# Patient Record
Sex: Male | Born: 1960 | Race: White | Hispanic: No | Smoking: Never smoker
Health system: Southern US, Community
[De-identification: ages and names within clinical notes are randomized; demographics above are authoritative.]

## PROBLEM LIST (undated history)

## (undated) DIAGNOSIS — C4490 Unspecified malignant neoplasm of skin, unspecified: Secondary | ICD-10-CM

## (undated) DIAGNOSIS — I4891 Unspecified atrial fibrillation: Secondary | ICD-10-CM

## (undated) DIAGNOSIS — M545 Low back pain: Secondary | ICD-10-CM

## (undated) DIAGNOSIS — N200 Calculus of kidney: Secondary | ICD-10-CM

## (undated) DIAGNOSIS — H269 Unspecified cataract: Secondary | ICD-10-CM

## (undated) DIAGNOSIS — G8929 Other chronic pain: Secondary | ICD-10-CM

## (undated) HISTORY — DX: Unspecified atrial fibrillation: I48.91

## (undated) HISTORY — PX: COLONOSCOPY: SHX174

## (undated) HISTORY — DX: Other chronic pain: G89.29

## (undated) HISTORY — DX: Unspecified malignant neoplasm of skin, unspecified: C44.90

## (undated) HISTORY — DX: Unspecified cataract: H26.9

## (undated) HISTORY — PX: ESOPHAGOGASTRODUODENOSCOPY: SHX1529

## (undated) HISTORY — DX: Low back pain: M54.5

## (undated) HISTORY — PX: OTHER SURGICAL HISTORY: SHX169

---

## 1996-03-25 HISTORY — PX: FUNCTIONAL ENDOSCOPIC SINUS SURGERY: SUR616

## 1998-11-23 ENCOUNTER — Emergency Department (HOSPITAL_COMMUNITY): Admission: EM | Admit: 1998-11-23 | Discharge: 1998-11-23 | Payer: Self-pay | Admitting: Emergency Medicine

## 2000-05-29 ENCOUNTER — Emergency Department (HOSPITAL_COMMUNITY): Admission: EM | Admit: 2000-05-29 | Discharge: 2000-05-29 | Payer: Self-pay | Admitting: Emergency Medicine

## 2000-05-29 ENCOUNTER — Encounter: Payer: Self-pay | Admitting: Emergency Medicine

## 2001-07-27 ENCOUNTER — Encounter: Payer: Self-pay | Admitting: Emergency Medicine

## 2001-07-27 ENCOUNTER — Emergency Department (HOSPITAL_COMMUNITY): Admission: EM | Admit: 2001-07-27 | Discharge: 2001-07-27 | Payer: Self-pay | Admitting: Emergency Medicine

## 2002-05-19 ENCOUNTER — Emergency Department (HOSPITAL_COMMUNITY): Admission: EM | Admit: 2002-05-19 | Discharge: 2002-05-19 | Payer: Self-pay | Admitting: Emergency Medicine

## 2002-08-20 ENCOUNTER — Emergency Department (HOSPITAL_COMMUNITY): Admission: EM | Admit: 2002-08-20 | Discharge: 2002-08-21 | Payer: Self-pay | Admitting: *Deleted

## 2003-02-25 ENCOUNTER — Encounter: Admission: RE | Admit: 2003-02-25 | Discharge: 2003-02-25 | Payer: Self-pay | Admitting: Internal Medicine

## 2003-06-05 ENCOUNTER — Emergency Department (HOSPITAL_COMMUNITY): Admission: EM | Admit: 2003-06-05 | Discharge: 2003-06-05 | Payer: Self-pay

## 2003-10-24 ENCOUNTER — Emergency Department (HOSPITAL_COMMUNITY): Admission: EM | Admit: 2003-10-24 | Discharge: 2003-10-24 | Payer: Self-pay | Admitting: Emergency Medicine

## 2003-10-26 ENCOUNTER — Ambulatory Visit (HOSPITAL_COMMUNITY): Admission: RE | Admit: 2003-10-26 | Discharge: 2003-10-26 | Payer: Self-pay | Admitting: Emergency Medicine

## 2014-05-26 ENCOUNTER — Emergency Department (HOSPITAL_COMMUNITY)
Admission: EM | Admit: 2014-05-26 | Discharge: 2014-05-26 | Disposition: A | Discharge status: Home / Self Care | Payer: BC Managed Care – PPO | Attending: Emergency Medicine | Admitting: Emergency Medicine

## 2014-05-26 ENCOUNTER — Emergency Department (HOSPITAL_COMMUNITY): Payer: BC Managed Care – PPO

## 2014-05-26 ENCOUNTER — Encounter (HOSPITAL_COMMUNITY): Payer: Self-pay | Admitting: Emergency Medicine

## 2014-05-26 DIAGNOSIS — Z79899 Other long term (current) drug therapy: Secondary | ICD-10-CM | POA: Diagnosis not present

## 2014-05-26 DIAGNOSIS — N200 Calculus of kidney: Secondary | ICD-10-CM | POA: Insufficient documentation

## 2014-05-26 DIAGNOSIS — R109 Unspecified abdominal pain: Secondary | ICD-10-CM

## 2014-05-26 HISTORY — DX: Calculus of kidney: N20.0

## 2014-05-26 LAB — URINALYSIS, ROUTINE W REFLEX MICROSCOPIC
Bilirubin Urine: NEGATIVE
Glucose, UA: NEGATIVE mg/dL
KETONES UR: NEGATIVE mg/dL
Leukocytes, UA: NEGATIVE
Nitrite: NEGATIVE
PH: 6 (ref 5.0–8.0)
Protein, ur: NEGATIVE mg/dL
Specific Gravity, Urine: 1.016 (ref 1.005–1.030)
UROBILINOGEN UA: 0.2 mg/dL (ref 0.0–1.0)

## 2014-05-26 LAB — URINE MICROSCOPIC-ADD ON

## 2014-05-26 LAB — COMPREHENSIVE METABOLIC PANEL
ALBUMIN: 4.5 g/dL (ref 3.5–5.2)
ALT: 49 U/L (ref 0–53)
AST: 35 U/L (ref 0–37)
Alkaline Phosphatase: 64 U/L (ref 39–117)
Anion gap: 7 (ref 5–15)
BILIRUBIN TOTAL: 0.5 mg/dL (ref 0.3–1.2)
BUN: 12 mg/dL (ref 6–23)
CO2: 27 mmol/L (ref 19–32)
Calcium: 9 mg/dL (ref 8.4–10.5)
Chloride: 103 mmol/L (ref 96–112)
Creatinine, Ser: 0.93 mg/dL (ref 0.50–1.35)
GFR calc Af Amer: >90 mL/min (ref >90)
GFR calc non Af Amer: >90 mL/min (ref >90)
Glucose, Bld: 112 mg/dL — ABNORMAL HIGH (ref 70–99)
Potassium: 4.3 mmol/L (ref 3.5–5.1)
SODIUM: 137 mmol/L (ref 135–145)
TOTAL PROTEIN: 7.1 g/dL (ref 6.0–8.3)

## 2014-05-26 LAB — CBC WITH DIFFERENTIAL/PLATELET
Basophils Absolute: 0 10*3/uL (ref 0.0–0.1)
Basophils Relative: 0 % (ref 0–1)
EOS ABS: 0 10*3/uL (ref 0.0–0.7)
EOS PCT: 0 % (ref 0–5)
HEMATOCRIT: 48.1 % (ref 39.0–52.0)
HEMOGLOBIN: 16.8 g/dL (ref 13.0–17.0)
LYMPHS ABS: 1.8 10*3/uL (ref 0.7–4.0)
Lymphocytes Relative: 18 % (ref 12–46)
MCH: 30.1 pg (ref 26.0–34.0)
MCHC: 34.9 g/dL (ref 30.0–36.0)
MCV: 86 fL (ref 78.0–100.0)
MONO ABS: 0.8 10*3/uL (ref 0.1–1.0)
MONOS PCT: 8 % (ref 3–12)
Neutro Abs: 7.6 10*3/uL (ref 1.7–7.7)
Neutrophils Relative %: 74 % (ref 43–77)
Platelets: 204 10*3/uL (ref 150–400)
RBC: 5.59 MIL/uL (ref 4.22–5.81)
RDW: 12.3 % (ref 11.5–15.5)
WBC: 10.2 10*3/uL (ref 4.0–10.5)

## 2014-05-26 LAB — LIPASE, BLOOD: Lipase: 28 U/L (ref 11–59)

## 2014-05-26 MED ORDER — KETOROLAC TROMETHAMINE 30 MG/ML IJ SOLN
30.0000 mg | Freq: Once | INTRAMUSCULAR | Status: AC
  Administered 2014-05-26: 30 mg via INTRAVENOUS
  Filled 2014-05-26: qty 1

## 2014-05-26 MED ORDER — HYDROCODONE-ACETAMINOPHEN 5-325 MG PO TABS: 1.0000 | ORAL_TABLET | ORAL | Status: DC | PRN

## 2014-05-26 MED ORDER — ONDANSETRON HCL 4 MG/2ML IJ SOLN
4.0000 mg | Freq: Once | INTRAMUSCULAR | Status: AC
  Administered 2014-05-26: 4 mg via INTRAVENOUS
  Filled 2014-05-26: qty 2

## 2014-05-26 MED ORDER — NAPROXEN 500 MG PO TABS: 500.0000 mg | ORAL_TABLET | Freq: Two times a day (BID) | ORAL | Status: DC

## 2014-05-26 NOTE — ED Provider Notes (Signed)
CSN: [id number]     Arrival date & time 05/26/14  1124 History   First MD Initiated Contact with Patient 05/26/14 1128     Chief Complaint  Patient presents with  . Flank Pain   Steve Nolan is a 54 y.o. male with a history of kidney stones who presents to the emergency department complaining of left flank pain beginning 3 hours ago. Patient rates his pain as 8 out of 10 and sharp and has progressively gotten worse. He reports feels similar to his previous kidney stones. He reports he went to his primary care provider's office today to request that he come to the emergency department for a CT scan. The patient is never had a CT scan for his kidney stones previously. His last BM was prior to arrival and normal. He denies injury or trauma to his back. The patient denies fevers, chills, dysuria, hematuria, abdominal pain, nausea, vomiting, diarrhea, penile or testicle pain or swelling or rashes.   (Consider location/radiation/quality/duration/timing/severity/associated sxs/prior Treatment) HPI  Past Medical History  Diagnosis Date  . Kidney stones    History reviewed. No pertinent past surgical history. History reviewed. No pertinent family history. History  Substance Use Topics  . Smoking status: Never Smoker   . Smokeless tobacco: Not on file  . Alcohol Use: No    Review of Systems  Constitutional: Negative for fever and chills.  HENT: Negative for congestion and sore throat.   Eyes: Negative for visual disturbance.  Respiratory: Negative for cough, shortness of breath and wheezing.   Cardiovascular: Negative for chest pain and palpitations.  Gastrointestinal: Negative for nausea, vomiting, abdominal pain, diarrhea, constipation and blood in stool.  Genitourinary: Positive for flank pain. Negative for dysuria, urgency, frequency, hematuria, decreased urine volume, discharge, penile swelling, scrotal swelling, difficulty urinating, genital sores, penile pain and testicular pain.   Musculoskeletal: Negative for back pain and neck pain.  Skin: Negative for rash.  Neurological: Negative for headaches.      Allergies  Sulfa antibiotics  Home Medications   Prior to Admission medications   Medication Sig Start Date End Date Taking? Authorizing Provider  tadalafil (CIALIS) 5 MG tablet Take 5 mg by mouth daily.   Yes Historical Provider, MD  HYDROcodone-acetaminophen (NORCO/VICODIN) 5-325 MG per tablet Take 1-2 tablets by mouth every 4 (four) hours as needed for severe pain. 05/26/14   Verda Cumins Mistey Hoffert, PA-C  naproxen (NAPROSYN) 500 MG tablet Take 1 tablet (500 mg total) by mouth 2 (two) times daily with a meal. 05/26/14   Verda Cumins Aviel Davalos, PA-C   BP 157/60 mmHg  Pulse 67  Temp(Src) 97.7 F (36.5 C) (Oral)  Resp 18  SpO2 99% Physical Exam  Constitutional: He appears well-developed and well-nourished. No distress.  HENT:  Head: Normocephalic and atraumatic.  Mouth/Throat: Oropharynx is clear and moist. No oropharyngeal exudate.  Eyes: Conjunctivae are normal. Pupils are equal, round, and reactive to light. Right eye exhibits no discharge. Left eye exhibits no discharge.  Neck: Neck supple.  Cardiovascular: Normal rate, regular rhythm, normal heart sounds and intact distal pulses.  Exam reveals no gallop and no friction rub.   No murmur heard. Pulmonary/Chest: Effort normal and breath sounds normal. No respiratory distress. He has no wheezes. He has no rales.  Abdominal: Soft. Bowel sounds are normal. He exhibits no distension and no mass. There is no tenderness. There is no rebound and no guarding.  Abdomen is soft and nontender to palpation. Bowel sounds are present. No CVA or  flank tenderness.  Musculoskeletal: He exhibits no edema or tenderness.  No back tenderness.   Lymphadenopathy:    He has no cervical adenopathy.  Neurological: He is alert. Coordination normal.  Skin: Skin is warm and dry. No rash noted. He is not diaphoretic. No erythema. No  pallor.  Psychiatric: He has a normal mood and affect. His behavior is normal.  Nursing note and vitals reviewed.   ED Course  Procedures (including critical care time) Labs Review Labs Reviewed  COMPREHENSIVE METABOLIC PANEL - Abnormal; Notable for the following:    Glucose, Bld 112 (*)    All other components within normal limits  URINALYSIS, ROUTINE W REFLEX MICROSCOPIC - Abnormal; Notable for the following:    Hgb urine dipstick MODERATE (*)    All other components within normal limits  LIPASE, BLOOD  CBC WITH DIFFERENTIAL/PLATELET  URINE MICROSCOPIC-ADD ON    Imaging Review Ct Renal Stone Study  05/26/2014   CLINICAL DATA:  Left flank pain starting in the morning  EXAM: CT ABDOMEN AND PELVIS WITHOUT CONTRAST  TECHNIQUE: Multidetector CT imaging of the abdomen and pelvis was performed following the standard protocol without IV contrast.  COMPARISON:  None.  FINDINGS: Lung bases are unremarkable. Sagittal images of the spine are unremarkable. There is mild hepatic fatty infiltration. No focal hepatic mass. No calcified gallstones are noted within gallbladder. Unenhanced pancreas, spleen and adrenal glands are unremarkable. There is mild left hydronephrosis. Moderate left hydroureter. Mild left perinephric stranding.  Nonobstructive calculus in midpole of the left kidney measures 4 mm.  Axial image 82 there is calcified obstructive calculus in distal left ureter measures 6 mm just above the left UVJ. No calcified calculi are noted within urinary bladder.  Abdominal aorta is unremarkable. No small bowel obstruction. Normal appendix. No pericecal inflammation. No right ureteral calculi.  No ascites or free air.  Moderate stool noted in cecum.  Prostate gland and seminal vesicles are unremarkable.  IMPRESSION: 1. There is mild left hydronephrosis and moderate left hydroureter. 2. There is 6 mm calcified obstructive calculus in distal left ureter just above the left UVJ. 3. Nonobstructive calculus  midpole of the left kidney measures 4 mm. 4. Fatty infiltration of the liver. 5. Normal appendix.  No pericecal inflammation. 6. No small bowel obstruction.   Electronically Signed   By: Lahoma Crocker M.D.   On: 05/26/2014 12:18     EKG Interpretation None      Filed Vitals:   05/26/14 1131 05/26/14 1200  BP: 157/60   Pulse: 67   Temp:  97.7 F (36.5 C)  TempSrc:  Oral  Resp: 18   SpO2: 99%      MDM   Meds given in ED:  Medications  ondansetron (ZOFRAN) injection 4 mg (4 mg Intravenous Given 05/26/14 1153)  ketorolac (TORADOL) 30 MG/ML injection 30 mg (30 mg Intravenous Given 05/26/14 1153)    New Prescriptions   HYDROCODONE-ACETAMINOPHEN (NORCO/VICODIN) 5-325 MG PER TABLET    Take 1-2 tablets by mouth every 4 (four) hours as needed for severe pain.   NAPROXEN (NAPROSYN) 500 MG TABLET    Take 1 tablet (500 mg total) by mouth 2 (two) times daily with a meal.    Final diagnoses:  Left flank pain  Kidney stone on left side   This is a 54 y.o. male with a history of kidney stones who presents to the emergency department complaining of left flank pain beginning 3 hours ago. Patient rates his pain as 8 out  of 10 and sharp and has progressively gotten worse. He reports feels similar to his previous kidney stones. Patient reports he has never had a CT scan of his abdomen previously. He would like a CT scan today. Patient denies any fevers, dysuria or hematuria. The patient's abdomen is soft and nontender to palpation. Patient is afebrile and nontoxic-appearing. Patient has no flank or CVA tenderness. Urinalysis indicates moderate hemoglobin and no signs of infection.  CMP and CBC are unremarkable. CT renal stone study shows a 6 mm obstructive calculus in the distal left ureter just above the left UVJ. There is also mild left hydronephrosis and moderate left hydroureter.  At reevaluation the patient reports his pain is completely resolved and he feels ready to be discharged. Patient provided  prescriptions for Naprosyn and Norco for breakthrough pain. Patient given follow-up information with urologist Dr. Risa Grill. I advised the patient to follow-up with their primary care provider this week. I advised the patient to return to the emergency department with new or worsening symptoms or new concerns. The patient verbalized understanding and agreement with plan.       Hanley Hays, PA-C 05/26/14 [place], DO 05/26/14 1546

## 2014-05-26 NOTE — ED Notes (Signed)
Provider at bedside

## 2014-05-26 NOTE — Discharge Instructions (Signed)
Kidney Stones  Kidney stones (urolithiasis) are deposits that form inside your kidneys. The intense pain is caused by the stone moving through the urinary tract. When the stone moves, the ureter goes into spasm around the stone. The stone is usually passed in the urine.   CAUSES   · A disorder that makes certain neck glands produce too much parathyroid hormone (primary hyperparathyroidism).  · A buildup of uric acid crystals, similar to gout in your joints.  · Narrowing (stricture) of the ureter.  · A kidney obstruction present at birth (congenital obstruction).  · Previous surgery on the kidney or ureters.  · Numerous kidney infections.  SYMPTOMS   · Feeling sick to your stomach (nauseous).  · Throwing up (vomiting).  · Blood in the urine (hematuria).  · Pain that usually spreads (radiates) to the groin.  · Frequency or urgency of urination.  DIAGNOSIS   · Taking a history and physical exam.  · Blood or urine tests.  · CT scan.  · Occasionally, an examination of the inside of the urinary bladder (cystoscopy) is performed.  TREATMENT   · Observation.  · Increasing your fluid intake.  · Extracorporeal shock wave lithotripsy--This is a noninvasive procedure that uses shock waves to break up kidney stones.  · Surgery may be needed if you have severe pain or persistent obstruction. There are various surgical procedures. Most of the procedures are performed with the use of small instruments. Only small incisions are needed to accommodate these instruments, so recovery time is minimized.  The size, location, and chemical composition are all important variables that will determine the proper choice of action for you. Talk to your health care provider to better understand your situation so that you will minimize the risk of injury to yourself and your kidney.   HOME CARE INSTRUCTIONS   · Drink enough water and fluids to keep your urine clear or pale yellow. This will help you to pass the stone or stone fragments.  · Strain  all urine through the provided strainer. Keep all particulate matter and stones for your health care provider to see. The stone causing the pain may be as small as a grain of salt. It is very important to use the strainer each and every time you pass your urine. The collection of your stone will allow your health care provider to analyze it and verify that a stone has actually passed. The stone analysis will often identify what you can do to reduce the incidence of recurrences.  · Only take over-the-counter or prescription medicines for pain, discomfort, or fever as directed by your health care provider.  · Make a follow-up appointment with your health care provider as directed.  · Get follow-up X-rays if required. The absence of pain does not always mean that the stone has passed. It may have only stopped moving. If the urine remains completely obstructed, it can cause loss of kidney function or even complete destruction of the kidney. It is your responsibility to make sure X-rays and follow-ups are completed. Ultrasounds of the kidney can show blockages and the status of the kidney. Ultrasounds are not associated with any radiation and can be performed easily in a matter of minutes.  SEEK MEDICAL CARE IF:  · You experience pain that is progressive and unresponsive to any pain medicine you have been prescribed.  SEEK IMMEDIATE MEDICAL CARE IF:   · Pain cannot be controlled with the prescribed medicine.  · You have a fever or   shaking chills.  · The severity or intensity of pain increases over 18 hours and is not relieved by pain medicine.  · You develop a new onset of abdominal pain.  · You feel faint or pass out.  · You are unable to urinate.  MAKE SURE YOU:   · Understand these instructions.  · Will watch your condition.  · Will get help right away if you are not doing well or get worse.  Document Released: 03/11/2005 Document Revised: 11/11/2012 Document Reviewed: 08/12/2012  ExitCare® Patient Information ©2015  [employer]. This information is not intended to replace advice given to you by your health care provider. Make sure you discuss any questions you have with your health care provider.    Dietary Guidelines to Help Prevent Kidney Stones  Your risk of kidney stones can be decreased by adjusting the foods you eat. The most important thing you can do is drink enough fluid. You should drink enough fluid to keep your urine clear or pale yellow. The following guidelines provide specific information for the type of kidney stone you have had.  GUIDELINES ACCORDING TO TYPE OF KIDNEY STONE  Calcium Oxalate Kidney Stones  · Reduce the amount of salt you eat. Foods that have a lot of salt cause your body to release excess calcium into your urine. The excess calcium can combine with a substance called oxalate to form kidney stones.  · Reduce the amount of animal protein you eat if the amount you eat is excessive. Animal protein causes your body to release excess calcium into your urine. Ask your dietitian how much protein from animal sources you should be eating.  · Avoid foods that are high in oxalates. If you take vitamins, they should have less than 500 mg of vitamin C. Your body turns vitamin C into oxalates. You do not need to avoid fruits and vegetables high in vitamin C.  Calcium Phosphate Kidney Stones  · Reduce the amount of salt you eat to help prevent the release of excess calcium into your urine.  · Reduce the amount of animal protein you eat if the amount you eat is excessive. Animal protein causes your body to release excess calcium into your urine. Ask your dietitian how much protein from animal sources you should be eating.  · Get enough calcium from food or take a calcium supplement (ask your dietitian for recommendations). Food sources of calcium that do not increase your risk of kidney stones include:  ¨ Broccoli.  ¨ Dairy products, such as cheese and yogurt.  ¨ Pudding.  Uric Acid Kidney Stones  · Do not  have more than 6 oz of animal protein per day.  FOOD SOURCES  Animal Protein Sources  · Meat (all types).  · Poultry.  · Eggs.  · Fish, seafood.  Foods High in Salt  · Salt seasonings.  · Soy sauce.  · Teriyaki sauce.  · Cured and processed meats.  · Salted crackers and snack foods.  · Fast food.  · Canned soups and most canned foods.  Foods High in Oxalates  · Grains:  ¨ Amaranth.  ¨ Barley.  ¨ Grits.  ¨ Wheat germ.  ¨ Bran.  ¨ Buckwheat flour.  ¨ All bran cereals.  ¨ Pretzels.  ¨ Whole wheat bread.  · Vegetables:  ¨ Beans (wax).  ¨ Beets and beet greens.  ¨ Collard greens.  ¨ Eggplant.  ¨ Escarole.  ¨ Leeks.  ¨ Okra.  ¨ Parsley.  ¨ Rutabagas.  ¨   Spinach.  ¨ Swiss chard.  ¨ Tomato paste.  ¨ Fried potatoes.  ¨ Sweet potatoes.  · Fruits:  ¨ Red currants.  ¨ Figs.  ¨ Kiwi.  ¨ Rhubarb.  · Meat and Other Protein Sources:  ¨ Beans (dried).  ¨ Soy burgers and other soybean products.  ¨ Miso.  ¨ Nuts (peanuts, almonds, pecans, cashews, hazelnuts).  ¨ Nut butters.  ¨ Sesame seeds and tahini (paste made of sesame seeds).  ¨ Poppy seeds.  · Beverages:  ¨ Chocolate drink mixes.  ¨ Soy milk.  ¨ Instant iced tea.  ¨ Juices made from high-oxalate fruits or vegetables.  · Other:  ¨ Carob.  ¨ Chocolate.  ¨ Fruitcake.  ¨ Marmalades.  Document Released: 07/06/2010 Document Revised: 03/16/2013 Document Reviewed: 02/05/2013  ExitCare® Patient Information ©2015 [employer]. This information is not intended to replace advice given to you by your health care provider. Make sure you discuss any questions you have with your health care provider.

## 2014-05-26 NOTE — ED Notes (Signed)
Pt states that he has hx of kidney stones.  Pt states that around 0900, he began having lt sided flank pain.  Denies dysuria/hematuria.

## 2016-11-11 ENCOUNTER — Ambulatory Visit: Admitting: Family Medicine

## 2016-11-11 ENCOUNTER — Ambulatory Visit (INDEPENDENT_AMBULATORY_CARE_PROVIDER_SITE_OTHER): Payer: BC Managed Care – PPO | Admitting: Family Medicine

## 2016-11-11 ENCOUNTER — Encounter: Payer: Self-pay | Admitting: Family Medicine

## 2016-11-11 VITALS — BP 118/80 | HR 93 | Temp 98.3°F | Ht 69.0 in | Wt 195.0 lb

## 2016-11-11 DIAGNOSIS — M545 Low back pain, unspecified: Secondary | ICD-10-CM

## 2016-11-11 DIAGNOSIS — G8929 Other chronic pain: Secondary | ICD-10-CM | POA: Diagnosis not present

## 2016-11-11 DIAGNOSIS — Z1283 Encounter for screening for malignant neoplasm of skin: Secondary | ICD-10-CM | POA: Diagnosis not present

## 2016-11-11 DIAGNOSIS — M25512 Pain in left shoulder: Secondary | ICD-10-CM

## 2016-11-11 HISTORY — DX: Other chronic pain: G89.29

## 2016-11-11 HISTORY — DX: Low back pain, unspecified: M54.50

## 2016-11-11 NOTE — Progress Notes (Signed)
Pre visit review using our clinic review tool, if applicable. No additional management support is needed unless otherwise documented below in the visit note. 

## 2016-11-11 NOTE — Progress Notes (Signed)
Chief Complaint  Patient presents with  . Establish Care       New Patient Visit SUBJECTIVE: HPI: Steve Nolan is an 56 y.o.male who is being seen for establishing care.  The patient was previously seen at [facility], [place].  Low back pain This has been going on for several months. Waxes and wanes. He had a lumbar spine X-ray that showed some mild degenerative changes. He is particularly concerned that it showed curvature and his friend's son needed a rod implanted surgically.The pain is on the left side. He does not do any routine stretching/exercises for his low back. He denies any numbness, tingling, weakness, or loss of bowel/bladder function.  L shoulder Had arthroscopic shoulder surgery in the late 80's for SLAP. He started having overhead lifting issues over the past year. 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 For the past year, he has been having issues with the posterior area of the shoulder, particularly with overhead activities. He believes that he was bench pressing when it started. No catching or locking.  Skin Used to have a dermatologist, but his old one retired. He had precancerous lesions on his head and would like to have them checked again.   Allergies  Allergen Reactions  . Sulfa Antibiotics Rash    Past Medical History:  Diagnosis Date  . A-fib ([place])   . Anemia   . Chronic low back pain 11/11/2016  . Kidney stones    Past Surgical History:  Procedure Laterality Date  . FUNCTIONAL ENDOSCOPIC SINUS SURGERY Bilateral 1998   deviated septum  . left shoulder surgery Left    arthoscopic   Social History   Social History  . Marital status: Divorced   Social History Main Topics  . Smoking status: Never Smoker  . Smokeless tobacco: Never Used  . Alcohol use No  . Drug use: No   Family History  Problem Relation Age of Onset  . Hypertension Mother   . Diabetes Mother   . Heart disease Father   . Stroke Maternal  Grandmother   . Hypertension Maternal Grandmother   . Drug abuse Maternal Grandmother   . Heart disease Maternal Grandfather   . Hypertension Maternal Grandfather   . Heart disease Paternal Grandfather      Current Outpatient Prescriptions:  .  tadalafil (CIALIS) 5 MG tablet, Take 5 mg by mouth daily., Disp: , Rfl:   ROS Neuro: Denies numbness/tingling  MSK: As noted in HPI   OBJECTIVE: BP 118/80 (BP Location: Left Arm, Patient Position: Sitting, Cuff Size: Normal)   Pulse 93   Temp 98.3 F (36.8 C) (Oral)   Ht 5\' 9"  (1.753 m)   Wt 195 lb (88.5 kg)   SpO2 95%   BMI 28.80 kg/m   Constitutional: -  VS reviewed -  Well developed, well nourished, appears stated age -  No apparent distress  Psychiatric: -  Oriented to person, place, and time -  Memory intact -  Affect and mood normal -  Fluent conversation, good eye contact -  Judgment and insight age appropriate  Eye: -  Conjunctivae clear, no discharge -  Pupils symmetric, round, reactive to light  ENMT: -  MMM    Pharynx moist, no exudate, no erythema  Neck: -  No gross swelling, no palpable masses -  Thyroid midline, not enlarged, mobile, no palpable masses  Cardiovascular: -  RRR -  No LE edema  Respiratory: -  Normal respiratory effort, no accessory  muscle use, no retraction -  Breath sounds equal, no wheezes, no ronchi, no crackles  Neurological:  -  CN II - XII grossly intact -  Sensation grossly intact to light touch, equal bilaterally  Musculoskeletal: -  No clubbing, no cyanosis -  L shoulder- slightly decreased forward flexion, negative speed's, Yergason's, O'Brien's, crossover, liftoff, Hawkins, +empty can; there is TTP over the posterior deltoid -  +TTP over left lumbar paraspinal musculature, negative straight leg, negative besides, 5/5 strength throughout the lower extremities;   Skin: -  No significant lesion on inspection -  Warm and dry to palpation   ASSESSMENT/PLAN: Chronic left-sided low back  pain without sciatica  Left shoulder pain, unspecified chronicity  Skin exam, screening for cancer - Plan: Ambulatory referral to Dermatology  Patient instructed to sign release of records form from his previous PCP. NSAIDs. Tylenol. Heat. Ice. Stretches/exercises given for both shoulder and back. No red flag signs.  Refer to derm per his request. Patient should return in 4 weeks to recheck shoulder and back pain. Will consider injection vs PT for shoulder and back respectively. The patient voiced understanding and agreement to the plan.  Greater than 40 minutes were spent face to face with the patient with greater than 50% of this time spent counseling on etiology and treatment of low back pain, discussing the results of his lumbar XR, shoulder pain etiology/treatment.    [place], DO 11/11/16  3:10 PM

## 2016-11-11 NOTE — Patient Instructions (Signed)
If you do not hear anything about your referral in the next 1-2 weeks, call our office and ask for an update.  EXERCISES  RANGE OF MOTION (ROM) AND STRETCHING EXERCISES These exercises may help you when beginning to rehabilitate your injury. Your symptoms may resolve with or without further involvement from your physician, physical therapist or athletic trainer. While completing these exercises, remember:   Restoring tissue flexibility helps normal motion to return to the joints. This allows healthier, less painful movement and activity.  An effective stretch should be held for at least 30 seconds.  A stretch should never be painful. You should only feel a gentle lengthening or release in the stretched tissue.  ROM - Pendulum  Bend at the waist so that your right / left arm falls away from your body. Support yourself with your opposite hand on a solid surface, such as a table or a countertop.  Your right / left arm should be perpendicular to the ground. If it is not perpendicular, you need to lean over farther. Relax the muscles in your right / left arm and shoulder as much as possible.  Gently sway your hips and trunk so they move your right / left arm without any use of your right / left shoulder muscles.  Progress your movements so that your right / left arm moves side to side, then forward and backward, and finally, both clockwise and counterclockwise.  Complete 10-15 repetitions in each direction. Many people use this exercise to relieve discomfort in their shoulder as well as to gain range of motion. Repeat 2-3 times. Complete this exercise once per day.  STRETCH - Flexion, Standing  Stand with good posture. With an underhand grip on your right / left hand and an overhand grip on the opposite hand, grasp a broomstick or cane so that your hands are a little more than shoulder-width apart.  Keeping your right / left elbow straight and shoulder muscles relaxed, push the stick with  your opposite hand to raise your right / left arm in front of your body and then overhead. Raise your arm until you feel a stretch in your right / left shoulder, but before you have increased shoulder pain.  Try to avoid shrugging your right / left shoulder as your arm rises by keeping your shoulder blade tucked down and toward your mid-back spine. Hold 15-20 seconds.  Slowly return to the starting position. Repeat 2-3 times. Complete this exercise once per day.  STRETCH - Internal Rotation  Place your right / left hand behind your back, palm-up.  Throw a towel or belt over your opposite shoulder. Grasp the towel/belt with your right / left hand.  While keeping an upright posture, gently pull up on the towel/belt until you feel a stretch in the front of your right / left shoulder.  Avoid shrugging your right / left shoulder as your arm rises by keeping your shoulder blade tucked down and toward your mid-back spine.  Hold 15-20. Release the stretch by lowering your opposite hand. Repeat 2-3 times. Complete this exercise once per day.  STRETCH - External Rotation and Abduction  Stagger your stance through a doorframe. It does not matter which foot is forward.  As instructed by your physician, physical therapist or athletic trainer, place your hands: ? And forearms above your head and on the door frame. ? And forearms at head-height and on the door frame. ? At elbow-height and on the door frame.  Keeping your head and chest  upright and your stomach muscles tight to prevent over-extending your low-back, slowly shift your weight onto your front foot until you feel a stretch across your chest and/or in the front of your shoulders.  Hold 15-20 seconds. Shift your weight to your back foot to release the stretch. Repeat 2-3 times. Complete this stretch once per day.   STRENGTHENING EXERCISES  These exercises may help you when beginning to rehabilitate your injury. They may resolve your  symptoms with or without further involvement from your physician, physical therapist or athletic trainer. While completing these exercises, remember:   Muscles can gain both the endurance and the strength needed for everyday activities through controlled exercises.  Complete these exercises as instructed by your physician, physical therapist or athletic trainer. Progress the resistance and repetitions only as guided.  You may experience muscle soreness or fatigue, but the pain or discomfort you are trying to eliminate should never worsen during these exercises. If this pain does worsen, stop and make certain you are following the directions exactly. If the pain is still present after adjustments, discontinue the exercise until you can discuss the trouble with your clinician.  If advised by your physician, during your recovery, avoid activity or exercises which involve actions that place your right / left hand or elbow above your head or behind your back or head. These positions stress the tissues which are trying to heal.  STRENGTH - Scapular Depression and Adduction  With good posture, sit on a firm chair. Supported your arms in front of you with pillows, arm rests or a table top. Have your elbows in line with the sides of your body.  Gently draw your shoulder blades down and toward your mid-back spine. Gradually increase the tension without tensing the muscles along the top of your shoulders and the back of your neck.  Hold for 5 seconds. Slowly release the tension and relax your muscles completely before completing the next repetition.  After you have practiced this exercise, remove the arm support and complete it in standing as well as sitting. Repeat 2 times. Complete this exercise every other day.   STRENGTH - External Rotators  Secure a rubber exercise band/tubing to a fixed object so that it is at the same height as your right / left elbow when you are standing or sitting on a firm  surface.  Stand or sit so that the secured exercise band/tubing is at your side that is not injured.  Bend your elbow 90 degrees. Place a folded towel or small pillow under your right / left arm so that your elbow is a few inches away from your side.  Keeping the tension on the exercise band/tubing, pull it away from your body, as if pivoting on your elbow. Be sure to keep your body steady so that the movement is only coming from your shoulder rotating.  Hold 5 seconds. Release the tension in a controlled manner as you return to the starting position. Repeat 2 times. Complete this exercise every other day.   STRENGTH - Supraspinatus  Stand or sit with good posture. Grasp a 2-3 lb weight or an exercise band/tubing so that your hand is "thumbs-up," like when you shake hands.  Slowly lift your right / left hand from your thigh into the air, traveling about 30 degrees from straight out at your side. Lift your hand to shoulder height or as far as you can without increasing any shoulder pain. Initially, many people do not lift their hands above  shoulder height.  Avoid shrugging your right / left shoulder as your arm rises by keeping your shoulder blade tucked down and toward your mid-back spine.  Hold for 4-5 seconds. Control the descent of your hand as you slowly return to your starting position. Repeat 2 times. Complete this exercise every other day.   STRENGTH - Shoulder Extensors  Secure a rubber exercise band/tubing so that it is at the height of your shoulders when you are either standing or sitting on a firm arm-less chair.  With a thumbs-up grip, grasp an end of the band/tubing in each hand. Straighten your elbows and lift your hands straight in front of you at shoulder height. Step back away from the secured end of band/tubing until it becomes tense.  Squeezing your shoulder blades together, pull your hands down to the sides of your thighs. Do not allow your hands to go behind  you.  Hold for 5 seconds. Slowly ease the tension on the band/tubing as you reverse the directions and return to the starting position. Repeat 2 times. Complete this exercise every other day.   STRENGTH - Scapular Retractors  Secure a rubber exercise band/tubing so that it is at the height of your shoulders when you are either standing or sitting on a firm arm-less chair.  With a palm-down grip, grasp an end of the band/tubing in each hand. Straighten your elbows and lift your hands straight in front of you at shoulder height. Step back away from the secured end of band/tubing until it becomes tense.  Squeezing your shoulder blades together, draw your elbows back as you bend them. Keep your upper arm lifted away from your body throughout the exercise.  Hold 5 seconds. Slowly ease the tension on the band/tubing as you reverse the directions and return to the starting position. Repeat 2 times. Complete this exercise every other day.  STRENGTH - Scapular Depressors  Find a sturdy chair without wheels, such as a from a dining room table.  Keeping your feet on the floor, lift your bottom from the seat and lock your elbows.  Keeping your elbows straight, allow gravity to pull your body weight down. Your shoulders will rise toward your ears.  Raise your body against gravity by drawing your shoulder blades down your back, shortening the distance between your shoulders and ears. Although your feet should always maintain contact with the floor, your feet should progressively support less body weight as you get stronger.  Hold 5 seconds. In a controlled and slow manner, lower your body weight to begin the next repetition. Repeat 2 times. Complete this exercise every other day.   This information is not intended to replace advice given to you by your health care provider. Make sure you discuss any questions you have with your health care provider.  EXERCISES  RANGE OF MOTION (ROM) AND  STRETCHING EXERCISES - Low Back Sprain Most people with lower back pain will find that their symptoms get worse with excessive bending forward (flexion) or arching at the lower back (extension). The exercises that will help resolve your symptoms will focus on the opposite motion.  Your physician, physical therapist or athletic trainer will help you determine which exercises will be most helpful to resolve your lower back pain. Do not complete any exercises without first consulting with your caregiver. Discontinue any exercises which make your symptoms worse, until you speak to your caregiver. If you have pain, numbness or tingling which travels down into your buttocks, leg or foot,  the goal of the therapy is for these symptoms to move closer to your back and eventually resolve. Sometimes, these leg symptoms will get better, but your lower back pain may worsen. This is often an indication of progress in your rehabilitation. Be very alert to any changes in your symptoms and the activities in which you participated in the 24 hours prior to the change. Sharing this information with your caregiver will allow him or her to most efficiently treat your condition. These exercises may help you when beginning to rehabilitate your injury. Your symptoms may resolve with or without further involvement from your physician, physical therapist or athletic trainer. While completing these exercises, remember:   Restoring tissue flexibility helps normal motion to return to the joints. This allows healthier, less painful movement and activity.  An effective stretch should be held for at least 30 seconds.  A stretch should never be painful. You should only feel a gentle lengthening or release in the stretched tissue. FLEXION RANGE OF MOTION AND STRETCHING EXERCISES:  STRETCH - Flexion, Single Knee to Chest   Lie on a firm bed or floor with both legs extended in front of you.  Keeping one leg in contact with the floor,  bring your opposite knee to your chest. Hold your leg in place by either grabbing behind your thigh or at your knee.  Pull until you feel a gentle stretch in your low back. Hold 15-20 seconds.  Slowly release your grasp and repeat the exercise with the opposite side. Repeat 2 times. Complete this exercise 1-2 times per day.   STRETCH - Flexion, Double Knee to Chest  Lie on a firm bed or floor with both legs extended in front of you.  Keeping one leg in contact with the floor, bring your opposite knee to your chest.  Tense your stomach muscles to support your back and then lift your other knee to your chest. Hold your legs in place by either grabbing behind your thighs or at your knees.  Pull both knees toward your chest until you feel a gentle stretch in your low back. Hold 15-20 seconds.  Tense your stomach muscles and slowly return one leg at a time to the floor. Repeat 2 times. Complete this exercise 1-2 times per day.   STRETCH - Low Trunk Rotation  Lie on a firm bed or floor. Keeping your legs in front of you, bend your knees so they are both pointed toward the ceiling and your feet are flat on the floor.  Extend your arms out to the side. This will stabilize your upper body by keeping your shoulders in contact with the floor.  Gently and slowly drop both knees together to one side until you feel a gentle stretch in your low back. Hold for 15-20 seconds.  Tense your stomach muscles to support your lower back as you bring your knees back to the starting position. Repeat the exercise to the other side. Repeat 2 times. Complete this exercise 1-2 times per day  EXTENSION RANGE OF MOTION AND FLEXIBILITY EXERCISES:  STRETCH - Extension, Prone on Elbows   Lie on your stomach on the floor, a bed will be too soft. Place your palms about shoulder width apart and at the height of your head.  Place your elbows under your shoulders. If this is too painful, stack pillows under your  chest.  Allow your body to relax so that your hips drop lower and make contact more completely with the floor.  Hold this position for 15-20 seconds.  Slowly return to lying flat on the floor. Repeat 2 times. Complete this exercise 1-2 times per day.   RANGE OF MOTION - Extension, Prone Press Ups  Lie on your stomach on the floor, a bed will be too soft. Place your palms about shoulder width apart and at the height of your head.  Keeping your back as relaxed as possible, slowly straighten your elbows while keeping your hips on the floor. You may adjust the placement of your hands to maximize your comfort. As you gain motion, your hands will come more underneath your shoulders.  Hold this position 15-20 seconds.  Slowly return to lying flat on the floor. Repeat 2 times. Complete this exercise 1-2 times per day.   RANGE OF MOTION- Quadruped, Neutral Spine   Assume a hands and knees position on a firm surface. Keep your hands under your shoulders and your knees under your hips. You may place padding under your knees for comfort.  Drop your head and point your tailbone toward the ground below you. This will round out your lower back like an angry cat. Hold this position for 15-20 seconds.  Slowly lift your head and release your tail bone so that your back sags into a large arch, like an old horse.  Hold this position for 15-20 seconds.  Repeat this until you feel limber in your low back.  Now, find your "sweet spot." This will be the most comfortable position somewhere between the two previous positions. This is your neutral spine. Once you have found this position, tense your stomach muscles to support your low back.  Hold this position for 15-20 seconds. Repeat 2 times. Complete this exercise 1-2 times per day.  STRENGTHENING EXERCISES - Low Back Sprain These exercises may help you when beginning to rehabilitate your injury. These exercises should be done near your "sweet spot."  This is the neutral, low-back arch, somewhere between fully rounded and fully arched, that is your least painful position. When performed in this safe range of motion, these exercises can be used for people who have either a flexion or extension based injury. These exercises may resolve your symptoms with or without further involvement from your physician, physical therapist or athletic trainer. While completing these exercises, remember:   Muscles can gain both the endurance and the strength needed for everyday activities through controlled exercises.  Complete these exercises as instructed by your physician, physical therapist or athletic trainer. Increase the resistance and repetitions only as guided.  You may experience muscle soreness or fatigue, but the pain or discomfort you are trying to eliminate should never worsen during these exercises. If this pain does worsen, stop and make certain you are following the directions exactly. If the pain is still present after adjustments, discontinue the exercise until you can discuss the trouble with your caregiver.  STRENGTHENING - Deep Abdominals, Pelvic Tilt   Lie on a firm bed or floor. Keeping your legs in front of you, bend your knees so they are both pointed toward the ceiling and your feet are flat on the floor.  Tense your lower abdominal muscles to press your low back into the floor. This motion will rotate your pelvis so that your tail bone is scooping upwards rather than pointing at your feet or into the floor. With a gentle tension and even breathing, hold this position for 10-15 seconds. Repeat 2 times. Complete this exercise 1 time per day.   STRENGTHENING -  Abdominals, Crunches   Lie on a firm bed or floor. Keeping your legs in front of you, bend your knees so they are both pointed toward the ceiling and your feet are flat on the floor. Cross your arms over your chest.  Slightly tip your chin down without bending your neck.  Tense  your abdominals and slowly lift your trunk high enough to just clear your shoulder blades. Lifting higher can put excessive stress on the lower back and does not further strengthen your abdominal muscles.  Control your return to the starting position. Repeat 2 times. Complete this exercise once every 1-2 days.   STRENGTHENING - Quadruped, Opposite UE/LE Lift   Assume a hands and knees position on a firm surface. Keep your hands under your shoulders and your knees under your hips. You may place padding under your knees for comfort.  Find your neutral spine and gently tense your abdominal muscles so that you can maintain this position. Your shoulders and hips should form a rectangle that is parallel with the floor and is not twisted.  Keeping your trunk steady, lift your right hand no higher than your shoulder and then your left leg no higher than your hip. Make sure you are not holding your breath. Hold this position for 15-20 seconds.  Continuing to keep your abdominal muscles tense and your back steady, slowly return to your starting position. Repeat with the opposite arm and leg. Repeat 2 times. Complete this exercise once every 1-2 days.   STRENGTHENING - Abdominals and Quadriceps, Straight Leg Raise   Lie on a firm bed or floor with both legs extended in front of you.  Keeping one leg in contact with the floor, bend the other knee so that your foot can rest flat on the floor.  Find your neutral spine, and tense your abdominal muscles to maintain your spinal position throughout the exercise.  Slowly lift your straight leg off the floor about 6 inches for a count of 15, making sure to not hold your breath.  Still keeping your neutral spine, slowly lower your leg all the way to the floor. Repeat this exercise with each leg 2 times. Complete this exercise once every 1-2 days. POSTURE AND BODY MECHANICS CONSIDERATIONS - Low Back Sprain Keeping correct posture when sitting, standing or  completing your activities will reduce the stress put on different body tissues, allowing injured tissues a chance to heal and limiting painful experiences. The following are general guidelines for improved posture. Your physician or physical therapist will provide you with any instructions specific to your needs. While reading these guidelines, remember:  The exercises prescribed by your provider will help you have the flexibility and strength to maintain correct postures.  The correct posture provides the best environment for your joints to work. All of your joints have less wear and tear when properly supported by a spine with good posture. This means you will experience a healthier, less painful body.  Correct posture must be practiced with all of your activities, especially prolonged sitting and standing. Correct posture is as important when doing repetitive low-stress activities (typing) as it is when doing a single heavy-load activity (lifting).  RESTING POSITIONS Consider which positions are most painful for you when choosing a resting position. If you have pain with flexion-based activities (sitting, bending, stooping, squatting), choose a position that allows you to rest in a less flexed posture. You would want to avoid curling into a fetal position on your side. If your  pain worsens with extension-based activities (prolonged standing, working overhead), avoid resting in an extended position such as sleeping on your stomach. Most people will find more comfort when they rest with their spine in a more neutral position, neither too rounded nor too arched. Lying on a non-sagging bed on your side with a pillow between your knees, or on your back with a pillow under your knees will often provide some relief. Keep in mind, being in any one position for a prolonged period of time, no matter how correct your posture, can still lead to stiffness. PROPER SITTING POSTURE In order to minimize stress and  discomfort on your spine, you must sit with correct posture. Sitting with good posture should be effortless for a healthy body. Returning to good posture is a gradual process. Many people can work toward this most comfortably by using various supports until they have the flexibility and strength to maintain this posture on their own. When sitting with proper posture, your ears will fall over your shoulders and your shoulders will fall over your hips. You should use the back of the chair to support your upper back. Your lower back will be in a neutral position, just slightly arched. You may place a small pillow or folded towel at the base of your lower back for  support.  When working at a desk, create an environment that supports good, upright posture. Without extra support, muscles tire, which leads to excessive strain on joints and other tissues. Keep these recommendations in mind:  CHAIR:  A chair should be able to slide under your desk when your back makes contact with the back of the chair. This allows you to work closely.  The chair's height should allow your eyes to be level with the upper part of your monitor and your hands to be slightly lower than your elbows.  BODY POSITION  Your feet should make contact with the floor. If this is not possible, use a foot rest.  Keep your ears over your shoulders. This will reduce stress on your neck and low back.  INCORRECT SITTING POSTURES  If you are feeling tired and unable to assume a healthy sitting posture, do not slouch or slump. This puts excessive strain on your back tissues, causing more damage and pain. Healthier options include:  Using more support, like a lumbar pillow.  Switching tasks to something that requires you to be upright or walking.  Talking a brief walk.  Lying down to rest in a neutral-spine position.  PROLONGED STANDING WHILE SLIGHTLY LEANING FORWARD  When completing a task that requires you to lean forward while  standing in one place for a long time, place either foot up on a stationary 2-4 inch high object to help maintain the best posture. When both feet are on the ground, the lower back tends to lose its slight inward curve. If this curve flattens (or becomes too large), then the back and your other joints will experience too much stress, tire more quickly, and can cause pain.  CORRECT STANDING POSTURES Proper standing posture should be assumed with all daily activities, even if they only take a few moments, like when brushing your teeth. As in sitting, your ears should fall over your shoulders and your shoulders should fall over your hips. You should keep a slight tension in your abdominal muscles to brace your spine. Your tailbone should point down to the ground, not behind your body, resulting in an over-extended swayback posture.   INCORRECT  STANDING POSTURES  Common incorrect standing postures include a forward head, locked knees and/or an excessive swayback. WALKING Walk with an upright posture. Your ears, shoulders and hips should all line-up.  PROLONGED ACTIVITY IN A FLEXED POSITION When completing a task that requires you to bend forward at your waist or lean over a low surface, try to find a way to stabilize 3 out of 4 of your limbs. You can place a hand or elbow on your thigh or rest a knee on the surface you are reaching across. This will provide you more stability, so that your muscles do not tire as quickly. By keeping your knees relaxed, or slightly bent, you will also reduce stress across your lower back. CORRECT LIFTING TECHNIQUES  DO :  Assume a wide stance. This will provide you more stability and the opportunity to get as close as possible to the object which you are lifting.  Tense your abdominals to brace your spine. Bend at the knees and hips. Keeping your back locked in a neutral-spine position, lift using your leg muscles. Lift with your legs, keeping your back straight.  Test  the weight of unknown objects before attempting to lift them.  Try to keep your elbows locked down at your sides in order get the best strength from your shoulders when carrying an object.     Always ask for help when lifting heavy or awkward objects. INCORRECT LIFTING TECHNIQUES DO NOT:   Lock your knees when lifting, even if it is a small object.  Bend and twist. Pivot at your feet or move your feet when needing to change directions.  Assume that you can safely pick up even a paperclip without proper posture.

## 2016-12-11 ENCOUNTER — Ambulatory Visit (INDEPENDENT_AMBULATORY_CARE_PROVIDER_SITE_OTHER): Payer: BC Managed Care – PPO | Admitting: Family Medicine

## 2016-12-11 ENCOUNTER — Encounter: Payer: Self-pay | Admitting: Family Medicine

## 2016-12-11 VITALS — BP 118/72 | HR 88 | Temp 98.3°F | Ht 69.0 in | Wt 194.4 lb

## 2016-12-11 DIAGNOSIS — Z13220 Encounter for screening for lipoid disorders: Secondary | ICD-10-CM

## 2016-12-11 DIAGNOSIS — Z114 Encounter for screening for human immunodeficiency virus [HIV]: Secondary | ICD-10-CM | POA: Diagnosis not present

## 2016-12-11 DIAGNOSIS — G8929 Other chronic pain: Secondary | ICD-10-CM

## 2016-12-11 DIAGNOSIS — M545 Low back pain, unspecified: Secondary | ICD-10-CM

## 2016-12-11 DIAGNOSIS — Z87442 Personal history of urinary calculi: Secondary | ICD-10-CM

## 2016-12-11 DIAGNOSIS — Z125 Encounter for screening for malignant neoplasm of prostate: Secondary | ICD-10-CM

## 2016-12-11 DIAGNOSIS — Z1159 Encounter for screening for other viral diseases: Secondary | ICD-10-CM

## 2016-12-11 DIAGNOSIS — M25512 Pain in left shoulder: Secondary | ICD-10-CM

## 2016-12-11 NOTE — Progress Notes (Signed)
Pre visit review using our clinic review tool, if applicable. No additional management support is needed unless otherwise documented below in the visit note. 

## 2016-12-11 NOTE — Progress Notes (Signed)
Musculoskeletal Exam  Patient: Steve Nolan DOB: 1961-02-20  DOS: 12/11/2016  SUBJECTIVE:  Chief Complaint:   Chief Complaint  Patient presents with  . Follow-up    back and shoulder problem    Steve Nolan is a 56 y.o.  male for evaluation and treatment of shoulder and back pain.  Seen 4 weeks ago for NP visit. Given home stretches/exercises, NSAIDs, Tylenol, heat and ice. Today he reports little improvement. Still feels the posterior shoulder pain, similar to when he had his SLAP lesion. His previous provider was going to send him to orthopedic surgery needed like to follow through with this referral.  He continues to have low back pain that he describes as a pinched nerve. He denies any numbness, tingling, burning/pins or needles sensation, weakness, or loss of bowel/bladder control. He is interested in seeing a specialist for this issue.  Kidney stone found on Lumbar XR, he has a hx of obstructing stone with hydronephrosis. He is wondering if there is anything that can be done with this to help dissolve it now. He would like to see a specialist if possible.    ROS: Musculoskeletal/Extremities: +L shoulder and back pain Neurologic: no numbness, tingling no weakness   Past Medical History:  Diagnosis Date  . A-fib ([place])   . Anemia   . Chronic low back pain 11/11/2016  . Kidney stones    Past Surgical History:  Procedure Laterality Date  . FUNCTIONAL ENDOSCOPIC SINUS SURGERY Bilateral 1998   deviated septum  . left shoulder surgery Left    arthoscopic   Family History  Problem Relation Age of Onset  . Hypertension Mother   . Diabetes Mother   . Heart disease Father   . Stroke Maternal Grandmother   . Hypertension Maternal Grandmother   . Drug abuse Maternal Grandmother   . Heart disease Maternal Grandfather   . Hypertension Maternal Grandfather   . Heart disease Paternal Grandfather    Current Outpatient Prescriptions  Medication Sig Dispense Refill  .  tadalafil (CIALIS) 5 MG tablet Take 5 mg by mouth daily.     Allergies  Allergen Reactions  . Sulfa Antibiotics Rash   Social History   Social History  . Marital status: Divorced   Social History Main Topics  . Smoking status: Never Smoker  . Smokeless tobacco: Never Used  . Alcohol use No  . Drug use: No   Objective: VITAL SIGNS: BP 118/72 (BP Location: Left Arm, Patient Position: Sitting, Cuff Size: Large)   Pulse 88   Temp 98.3 F (36.8 C) (Oral)   Ht 5\' 9"  (1.753 m)   Wt 194 lb 6 oz (88.2 kg)   SpO2 98%   BMI 28.70 kg/m  Constitutional: Well formed, well developed. No acute distress. Cardiovascular: Brisk cap refill Thorax & Lungs: No accessory muscle use Extremities: No clubbing. No cyanosis. No edema.  Skin: Warm. Dry. No erythema. No rash.  Musculoskeletal: L shoulder.   Normal active range of motion: slightly decreased forward flexion.   Normal passive range of motion: also slightly decreased forward flexion Tenderness to palpation: yes, over post deltoid region Deformity: no Ecchymosis: no Tests positive: Obrien's Tests negative: Hawkins, Neer's Low back- neg straight leg, Lesegue's, poor hamstring flexion b/l; TTP over L lateral lumbar paraspinal and erector spinae musculature. 5/5 strength throughout in UE's.  Neurologic: Normal sensory function. No focal deficits noted. DTR's equal and symmetry in UE's and LE's. No clonus. Psychiatric: Normal mood. Age appropriate judgment and insight. Alert &  oriented x 3.    Assessment:  Chronic left-sided low back pain without sciatica - Plan: Ambulatory referral to Orthopedic Surgery  Chronic left shoulder pain - Plan: Ambulatory referral to Physical Medicine Rehab  History of kidney stones - Plan: Ambulatory referral to Urology, CBC  Encounter for hepatitis C screening test for low risk patient - Plan: Hepatitis C Antibody  Encounter for screening for HIV - Plan: HIV antibody  Screening cholesterol level -  Plan: Lipid panel, Comprehensive metabolic panel  Screening for prostate cancer - Plan: PSA  Plan: Orders as above. Discussed PT for LBP, he preferred a referral. For his shoulder, offered MRI vs PT vs referral, but he preferred referral. Discussed that urology may not offer anything given the fact he is asymptomatic. He wishes to have the discussion nonetheless. Referral placed. F/u in 6 weeks for CPE, fasting labs ordered for prior.  The patient voiced understanding and agreement to the plan.   [place], DO 12/11/16  2:01 PM

## 2016-12-11 NOTE — Patient Instructions (Addendum)
If you do not hear anything about your referrals in the next 1-2 weeks, call our office and ask for an update.  Come fasting 1 week before for your labs.

## 2016-12-19 ENCOUNTER — Encounter: Payer: Self-pay | Admitting: Family Medicine

## 2016-12-20 ENCOUNTER — Encounter: Payer: Self-pay | Admitting: Family Medicine

## 2017-01-06 ENCOUNTER — Other Ambulatory Visit: Payer: Self-pay | Admitting: Urology

## 2017-01-08 ENCOUNTER — Other Ambulatory Visit (INDEPENDENT_AMBULATORY_CARE_PROVIDER_SITE_OTHER): Payer: BC Managed Care – PPO

## 2017-01-08 ENCOUNTER — Encounter (HOSPITAL_COMMUNITY): Payer: Self-pay | Admitting: General Practice

## 2017-01-08 DIAGNOSIS — Z13220 Encounter for screening for lipoid disorders: Secondary | ICD-10-CM | POA: Diagnosis not present

## 2017-01-08 DIAGNOSIS — Z125 Encounter for screening for malignant neoplasm of prostate: Secondary | ICD-10-CM | POA: Diagnosis not present

## 2017-01-08 DIAGNOSIS — Z114 Encounter for screening for human immunodeficiency virus [HIV]: Secondary | ICD-10-CM

## 2017-01-08 DIAGNOSIS — Z1159 Encounter for screening for other viral diseases: Secondary | ICD-10-CM

## 2017-01-08 DIAGNOSIS — Z87442 Personal history of urinary calculi: Secondary | ICD-10-CM

## 2017-01-08 LAB — LIPID PANEL
Cholesterol: 224 mg/dL — ABNORMAL HIGH (ref 0–200)
HDL: 47.6 mg/dL (ref >39.00)
LDL CALC: 158 mg/dL — AB (ref 0–99)
NonHDL: 176.02
TRIGLYCERIDES: 90 mg/dL (ref 0.0–149.0)
Total CHOL/HDL Ratio: 5
VLDL: 18 mg/dL (ref 0.0–40.0)

## 2017-01-08 LAB — COMPREHENSIVE METABOLIC PANEL
ALT: 30 U/L (ref 0–53)
AST: 24 U/L (ref 0–37)
Albumin: 4.5 g/dL (ref 3.5–5.2)
Alkaline Phosphatase: 68 U/L (ref 39–117)
BUN: 14 mg/dL (ref 6–23)
CALCIUM: 9.9 mg/dL (ref 8.4–10.5)
CHLORIDE: 102 meq/L (ref 96–112)
CO2: 29 mEq/L (ref 19–32)
Creatinine, Ser: 0.98 mg/dL (ref 0.40–1.50)
GFR: 83.63 mL/min (ref >60.00)
GLUCOSE: 98 mg/dL (ref 70–99)
Potassium: 4.7 mEq/L (ref 3.5–5.1)
Sodium: 140 mEq/L (ref 135–145)
Total Bilirubin: 0.8 mg/dL (ref 0.2–1.2)
Total Protein: 7 g/dL (ref 6.0–8.3)

## 2017-01-08 LAB — HEPATITIS C ANTIBODY
Hepatitis C Ab: NONREACTIVE
SIGNAL TO CUT-OFF: 0.01 (ref <1.00)

## 2017-01-08 LAB — CBC
HEMATOCRIT: 52 % (ref 39.0–52.0)
Hemoglobin: 17.6 g/dL — ABNORMAL HIGH (ref 13.0–17.0)
MCHC: 33.9 g/dL (ref 30.0–36.0)
MCV: 87.4 fl (ref 78.0–100.0)
PLATELETS: 265 10*3/uL (ref 150.0–400.0)
RBC: 5.94 Mil/uL — ABNORMAL HIGH (ref 4.22–5.81)
RDW: 12.8 % (ref 11.5–15.5)
WBC: 6.6 10*3/uL (ref 4.0–10.5)

## 2017-01-08 LAB — PSA: PSA: 1.24 ng/mL (ref 0.10–4.00)

## 2017-01-08 LAB — HIV ANTIBODY (ROUTINE TESTING W REFLEX): HIV 1&2 Ab, 4th Generation: NONREACTIVE

## 2017-01-09 ENCOUNTER — Encounter (HOSPITAL_COMMUNITY)
Admission: RE | Disposition: A | Discharge status: Home / Self Care | Payer: Self-pay | Source: Ambulatory Visit | Attending: Urology

## 2017-01-09 ENCOUNTER — Ambulatory Visit (HOSPITAL_COMMUNITY)
Admission: RE | Admit: 2017-01-09 | Discharge: 2017-01-09 | Disposition: A | Discharge status: Home / Self Care | Payer: BC Managed Care – PPO | Source: Ambulatory Visit | Attending: Urology | Admitting: Urology

## 2017-01-09 ENCOUNTER — Encounter (HOSPITAL_COMMUNITY): Payer: Self-pay | Admitting: General Practice

## 2017-01-09 ENCOUNTER — Ambulatory Visit (HOSPITAL_COMMUNITY): Payer: BC Managed Care – PPO

## 2017-01-09 DIAGNOSIS — N200 Calculus of kidney: Secondary | ICD-10-CM | POA: Insufficient documentation

## 2017-01-09 DIAGNOSIS — Z882 Allergy status to sulfonamides status: Secondary | ICD-10-CM | POA: Diagnosis not present

## 2017-01-09 DIAGNOSIS — N201 Calculus of ureter: Secondary | ICD-10-CM

## 2017-01-09 HISTORY — PX: EXTRACORPOREAL SHOCK WAVE LITHOTRIPSY: SHX1557

## 2017-01-09 SURGERY — LITHOTRIPSY, ESWL
Anesthesia: LOCAL | Laterality: Left

## 2017-01-09 MED ORDER — DIAZEPAM 5 MG PO TABS
10.0000 mg | ORAL_TABLET | ORAL | Status: AC
  Administered 2017-01-09: 10 mg via ORAL
  Filled 2017-01-09: qty 2

## 2017-01-09 MED ORDER — CIPROFLOXACIN HCL 500 MG PO TABS
500.0000 mg | ORAL_TABLET | ORAL | Status: AC
  Administered 2017-01-09: 500 mg via ORAL
  Filled 2017-01-09: qty 1

## 2017-01-09 MED ORDER — DIPHENHYDRAMINE HCL 25 MG PO CAPS
25.0000 mg | ORAL_CAPSULE | ORAL | Status: AC
  Administered 2017-01-09: 25 mg via ORAL
  Filled 2017-01-09: qty 1

## 2017-01-09 MED ORDER — SODIUM CHLORIDE 0.9 % IV SOLN
INTRAVENOUS | Status: DC
  Administered 2017-01-09: 07:00:00 via INTRAVENOUS

## 2017-01-09 NOTE — Discharge Instructions (Signed)
Lithotripsy, Care After °This sheet gives you information about how to care for yourself after your procedure. Your health care provider may also give you more specific instructions. If you have problems or questions, contact your health care provider. °What can I expect after the procedure? °After the procedure, it is common to have: °· Some blood in your urine. This should only last for a few days. °· Soreness in your back, sides, or upper abdomen for a few days. °· Blotches or bruises on your back where the pressure wave entered the skin. °· Pain, discomfort, or nausea when pieces (fragments) of the kidney stone move through the tube that carries urine from the kidney to the bladder (ureter). Stone fragments may pass soon after the procedure, but they may continue to pass for up to 4-8 weeks. °? If you have severe pain or nausea, contact your health care provider. This may be caused by a large stone that was not broken up, and this may mean that you need more treatment. °· Some pain or discomfort during urination. °· Some pain or discomfort in the lower abdomen or (in men) at the base of the penis. ° °Follow these instructions at home: °Medicines °· Take over-the-counter and prescription medicines only as told by your health care provider. °· If you were prescribed an antibiotic medicine, take it as told by your health care provider. Do not stop taking the antibiotic even if you start to feel better. °· Do not drive for 24 hours if you were given a medicine to help you relax (sedative). °· Do not drive or use heavy machinery while taking prescription pain medicine. °Eating and drinking °· Drink enough water and fluids to keep your urine clear or pale yellow. This helps any remaining pieces of the stone to pass. It can also help prevent new stones from forming. °· Eat plenty of fresh fruits and vegetables. °· Follow instructions from your health care provider about eating and drinking restrictions. You may be  instructed: °? To reduce how much salt (sodium) you eat or drink. Check ingredients and nutrition facts on packaged foods and beverages. °? To reduce how much meat you eat. °· Eat the recommended amount of calcium for your age and gender. Ask your health care provider how much calcium you should have. °General instructions °· Get plenty of rest. °· Most people can resume normal activities 1-2 days after the procedure. Ask your health care provider what activities are safe for you. °· If directed, strain all urine through the strainer that was provided by your health care provider. °? Keep all fragments for your health care provider to see. Any stones that are found may be sent to a medical lab for examination. The stone may be as small as a grain of salt. °· Keep all follow-up visits as told by your health care provider. This is important. °Contact a health care provider if: °· You have pain that is severe or does not get better with medicine. °· You have nausea that is severe or does not go away. °· You have blood in your urine longer than your health care provider told you to expect. °· You have more blood in your urine. °· You have pain during urination that does not go away. °· You urinate more frequently than usual and this does not go away. °· You develop a rash or any other possible signs of an allergic reaction. °Get help right away if: °· You have severe pain in   your back, sides, or upper abdomen. °· You have severe pain while urinating. °· Your urine is very dark red. °· You have blood in your stool (feces). °· You cannot pass any urine at all. °· You feel a strong urge to urinate after emptying your bladder. °· You have a fever or chills. °· You develop shortness of breath, difficulty breathing, or chest pain. °· You have severe nausea that leads to persistent vomiting. °· You faint. °Summary °· After this procedure, it is common to have some pain, discomfort, or nausea when pieces (fragments) of the  kidney stone move through the tube that carries urine from the kidney to the bladder (ureter). If this pain or nausea is severe, however, you should contact your health care provider. °· Most people can resume normal activities 1-2 days after the procedure. Ask your health care provider what activities are safe for you. °· Drink enough water and fluids to keep your urine clear or pale yellow. This helps any remaining pieces of the stone to pass, and it can help prevent new stones from forming. °· If directed, strain your urine and keep all fragments for your health care provider to see. Fragments or stones may be as small as a grain of salt. °· Get help right away if you have severe pain in your back, sides, or upper abdomen or have severe pain while urinating. °This information is not intended to replace advice given to you by your health care provider. Make sure you discuss any questions you have with your health care provider. °Document Released: 03/31/2007 Document Revised: 01/31/2016 Document Reviewed: 01/31/2016 °Elsevier Interactive Patient Education © 2017 [employer] ° °

## 2017-01-10 ENCOUNTER — Encounter: Payer: Self-pay | Admitting: Family Medicine

## 2017-01-14 ENCOUNTER — Encounter: Payer: Self-pay | Admitting: Physical Medicine & Rehabilitation

## 2017-01-14 ENCOUNTER — Encounter: Payer: BC Managed Care – PPO | Attending: Physical Medicine & Rehabilitation

## 2017-01-14 ENCOUNTER — Ambulatory Visit (HOSPITAL_BASED_OUTPATIENT_CLINIC_OR_DEPARTMENT_OTHER)
Admission: RE | Admit: 2017-01-14 | Discharge: 2017-01-14 | Disposition: A | Discharge status: Home / Self Care | Payer: BC Managed Care – PPO | Source: Ambulatory Visit | Attending: Physical Medicine & Rehabilitation | Admitting: Physical Medicine & Rehabilitation

## 2017-01-14 ENCOUNTER — Ambulatory Visit (HOSPITAL_BASED_OUTPATIENT_CLINIC_OR_DEPARTMENT_OTHER): Payer: BC Managed Care – PPO | Admitting: Physical Medicine & Rehabilitation

## 2017-01-14 VITALS — BP 119/80 | HR 70 | Resp 14

## 2017-01-14 DIAGNOSIS — M25512 Pain in left shoulder: Secondary | ICD-10-CM | POA: Insufficient documentation

## 2017-01-14 DIAGNOSIS — G8929 Other chronic pain: Secondary | ICD-10-CM | POA: Diagnosis not present

## 2017-01-14 DIAGNOSIS — Z87442 Personal history of urinary calculi: Secondary | ICD-10-CM | POA: Insufficient documentation

## 2017-01-14 DIAGNOSIS — M19012 Primary osteoarthritis, left shoulder: Secondary | ICD-10-CM | POA: Insufficient documentation

## 2017-01-14 DIAGNOSIS — Z8249 Family history of ischemic heart disease and other diseases of the circulatory system: Secondary | ICD-10-CM | POA: Insufficient documentation

## 2017-01-14 DIAGNOSIS — Z833 Family history of diabetes mellitus: Secondary | ICD-10-CM | POA: Diagnosis not present

## 2017-01-14 DIAGNOSIS — M24112 Other articular cartilage disorders, left shoulder: Secondary | ICD-10-CM | POA: Diagnosis not present

## 2017-01-14 DIAGNOSIS — M545 Low back pain: Secondary | ICD-10-CM | POA: Diagnosis not present

## 2017-01-14 NOTE — Progress Notes (Signed)
Subjective:    Patient ID: Steve Nolan, male    DOB: 11/07/1960, 56 y.o.   MRN: [id number]  HPI  Left shoulder pain x 1 year  Hx of Left shoulder surgery in 1990s, told he had SLAP tear and also had "bone trimmed" to give it more room, under arthroscopic guidance  Pt did well post op until he started benching heavy weight.  Has stopped benching  No pain at rest , Pain with benching and military press and with curling free weights.   Uses ice after exercise Still does biceps curls and military press Has tried laying off for weeks at a time but feels bad when he gets out of shape.  Does treadmill, upright rows, ok with bend over flyes  Primary care has given him exercises to do with Thera-Band's.  Patient has not had any shoulder x-rays.  No numbness or tingling, no neck pain.    Pain Inventory Average Pain 6 Pain Right Now 6 My pain is sharp  In the last 24 hours, has pain interfered with the following? General activity 5 Relation with others 0 Enjoyment of life 4 What TIME of day is your pain at its worst? varies with activity Sleep (in general) Good  Pain is worse with: some activites Pain improves with: heat/ice Relief from Meds: 0  Mobility walk without assistance how many minutes can you walk? unlimited ability to climb steps?  yes do you drive?  yes  Function employed # of hrs/week 40  Neuro/Psych No problems in this area  Prior Studies new visit  Physicians involved in your care new visit   Family History  Problem Relation Age of Onset  . Hypertension Mother   . Diabetes Mother   . Heart disease Father   . Stroke Maternal Grandmother   . Hypertension Maternal Grandmother   . Drug abuse Maternal Grandmother   . Heart disease Maternal Grandfather   . Hypertension Maternal Grandfather   . Heart disease Paternal Grandfather    Social History   Social History  . Marital status: Divorced    Spouse name: N/A  . Number of children:  N/A  . Years of education: N/A   Social History Main Topics  . Smoking status: Never Smoker  . Smokeless tobacco: Never Used  . Alcohol use No  . Drug use: No  . Sexual activity: Not Asked   Other Topics Concern  . None   Social History Narrative  . None   Past Surgical History:  Procedure Laterality Date  . EXTRACORPOREAL SHOCK WAVE LITHOTRIPSY Left 01/09/2017   Procedure: LEFT EXTRACORPOREAL SHOCK WAVE LITHOTRIPSY (ESWL);  Surgeon: Cleon Gustin, MD;  Location: [facility];  Service: Urology;  Laterality: Left;  . FUNCTIONAL ENDOSCOPIC SINUS SURGERY Bilateral 1998   deviated septum  . left shoulder surgery Left    arthoscopic   Past Medical History:  Diagnosis Date  . A-fib ([place])    20 years ago,went to cardiologist no treatment needed and no follow-up - dx. torn muscle.  . Chronic low back pain 11/11/2016  . Kidney stones    BP 119/80 (BP Location: Right Arm, Patient Position: Sitting, Cuff Size: Normal)   Pulse 70   Resp 14   SpO2 97%   Opioid Risk Score:   Fall Risk Score:  `1  Depression screen PHQ 2/9  Depression screen PHQ 2/9 01/14/2017  Decreased Interest 0  Down, Depressed, Hopeless 0  PHQ - 2 Score 0  Altered sleeping 0  Tired, decreased energy 0  Change in appetite 0  Feeling bad or failure about yourself  0  Trouble concentrating 0  Moving slowly or fidgety/restless 0  Suicidal thoughts 0  PHQ-9 Score 0  Difficult doing work/chores Not difficult at all    Review of Systems  Constitutional: Negative.   HENT: Negative.   Eyes: Negative.   Respiratory: Negative.   Cardiovascular: Negative.   Gastrointestinal: Negative.   Endocrine: Negative.   Genitourinary: Negative.   Musculoskeletal: Positive for arthralgias and back pain.  Skin: Negative.   Neurological: Negative.   Hematological: Negative.   Psychiatric/Behavioral: Negative.   All other systems reviewed and are negative.      Objective:   Physical Exam  Constitutional: He is  oriented to person, place, and time. He appears well-developed and well-nourished. No distress.  HENT:  Head: Normocephalic and atraumatic.  Eyes: Pupils are equal, round, and reactive to light. Conjunctivae and EOM are normal.  Neck: Normal range of motion.  No pain withRange of motion  Cardiovascular: Normal rate, regular rhythm and normal heart sounds.   Pulmonary/Chest: Effort normal. No respiratory distress. He has wheezes.  Abdominal: Soft. Bowel sounds are normal. He exhibits no distension. There is no tenderness.  Musculoskeletal:  Negative Spurling's test Negative drop arm test Negative Michel Bickers test Positive O'Brien test on the left side  No pain over the left acromioclavicular joint No pain with adduction of the left shoulder  Shoulder internal rotation equal thumb to T9 bilaterally Pain with Gerber's subscapularis testing on the left side  Left shoulder range of motion full flexion extension la, horizontal abduction and adduction  Shoulder girdle showing no evidence of atrophy Evidence of winging of the scapula  Neurological: He is alert and oriented to person, place, and time.  Skin: He is not diaphoretic.  Psychiatric: He has a normal mood and affect. His behavior is normal. Judgment and thought content normal.  Vitals reviewed.  Motor strength is 5/5 bilateral deltoid, bicep, tricep, grip Sensation normal over the C5-6-7 8 dermatomes No evidence of shoulder crepitus on the left side  X-ray left shoulder, AP view shows ample space in the subacromial area.  There is no evidence of fracture or dislocation Axillary view reveals mild acromioclavicular joint irregularity    Assessment & Plan:  1.  Left shoulder pain, chronic, this is mainly posterior with loading.  No evidence of shoulder instability, impingement testing is negative. 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  Prior history of SLAP lesion. We discussed anatomy of the  area as well as his current weight lifting program.  We discussed he should avoid military presses as well as bench presses.  He can work around [place] that do not rely on his shoulder girdle to stabilize.  For example using pectoralis machine rather than bench pressing.  Using posterior flyes rather than military press  x-ray of the  Left shoulder is unremarkable we will need to proceed with MR arthrogram of the left shoulder to look specifically at the left glenoid labrum. Patient is not interested in any type analgesics since this is really occurring just with exercise. I do agree with icing after exercise

## 2017-01-14 NOTE — Patient Instructions (Signed)
Please work around your injury, advise no heavy overhead lifting or bench pressing

## 2017-01-21 ENCOUNTER — Telehealth: Payer: Self-pay | Admitting: *Deleted

## 2017-01-21 ENCOUNTER — Other Ambulatory Visit: Payer: Self-pay | Admitting: Physical Medicine & Rehabilitation

## 2017-01-21 ENCOUNTER — Telehealth: Payer: Self-pay

## 2017-01-21 DIAGNOSIS — M25512 Pain in left shoulder: Principal | ICD-10-CM

## 2017-01-21 DIAGNOSIS — G8929 Other chronic pain: Secondary | ICD-10-CM

## 2017-01-21 NOTE — Telephone Encounter (Signed)
 radiology called, stated that the incorrect orders were placed into for this patients MRI, stated that the original order and appointment has been canceled until the corrections can be made, stated also that the order did not match last doctors note  Also asked that you use these 2 codes:  [id number]  Please verify and adivse

## 2017-01-21 NOTE — Telephone Encounter (Signed)
I tried placing the MRI of the shoulder once again, left side. [id number]. I did not see what IMG [phone] is Please have a radiologist call me about this, I'm not sure if the patient needs any additional study than what I have already ordered

## 2017-01-21 NOTE — Telephone Encounter (Signed)
Called Radiology back, they stated that they will fix the order for the patient and send the orders back to Dr. Read Drivers to be signed and they will call the patient to schedule.

## 2017-01-22 ENCOUNTER — Ambulatory Visit (INDEPENDENT_AMBULATORY_CARE_PROVIDER_SITE_OTHER): Payer: BC Managed Care – PPO | Admitting: Family Medicine

## 2017-01-22 ENCOUNTER — Encounter: Payer: Self-pay | Admitting: Family Medicine

## 2017-01-22 ENCOUNTER — Ambulatory Visit (HOSPITAL_COMMUNITY): Admission: RE | Admit: 2017-01-22 | Payer: BC Managed Care – PPO | Source: Ambulatory Visit

## 2017-01-22 VITALS — BP 118/72 | HR 80 | Temp 98.2°F | Ht 69.0 in | Wt 193.5 lb

## 2017-01-22 DIAGNOSIS — Z0000 Encounter for general adult medical examination without abnormal findings: Secondary | ICD-10-CM

## 2017-01-22 DIAGNOSIS — Z23 Encounter for immunization: Secondary | ICD-10-CM | POA: Diagnosis not present

## 2017-01-22 DIAGNOSIS — D582 Other hemoglobinopathies: Secondary | ICD-10-CM | POA: Diagnosis not present

## 2017-01-22 DIAGNOSIS — Z1211 Encounter for screening for malignant neoplasm of colon: Secondary | ICD-10-CM | POA: Diagnosis not present

## 2017-01-22 LAB — CBC WITH DIFFERENTIAL/PLATELET
BASOS PCT: 0.4 % (ref 0.0–3.0)
Basophils Absolute: 0 10*3/uL (ref 0.0–0.1)
EOS ABS: 0.1 10*3/uL (ref 0.0–0.7)
Eosinophils Relative: 1.8 % (ref 0.0–5.0)
HEMATOCRIT: 49.7 % (ref 39.0–52.0)
HEMOGLOBIN: 16.6 g/dL (ref 13.0–17.0)
LYMPHS PCT: 33.8 % (ref 12.0–46.0)
Lymphs Abs: 1.9 10*3/uL (ref 0.7–4.0)
MCHC: 33.4 g/dL (ref 30.0–36.0)
MCV: 88.3 fl (ref 78.0–100.0)
MONOS PCT: 6.5 % (ref 3.0–12.0)
Monocytes Absolute: 0.4 10*3/uL (ref 0.1–1.0)
NEUTROS ABS: 3.3 10*3/uL (ref 1.4–7.7)
Neutrophils Relative %: 57.5 % (ref 43.0–77.0)
Platelets: 211 10*3/uL (ref 150.0–400.0)
RBC: 5.63 Mil/uL (ref 4.22–5.81)
RDW: 13 % (ref 11.5–15.5)
WBC: 5.8 10*3/uL (ref 4.0–10.5)

## 2017-01-22 NOTE — Addendum Note (Signed)
Addended by: Sharon Seller B on: 01/22/2017 09:16 AM   Modules accepted: Orders

## 2017-01-22 NOTE — Progress Notes (Signed)
Pre visit review using our clinic review tool, if applicable. No additional management support is needed unless otherwise documented below in the visit note. 

## 2017-01-22 NOTE — Progress Notes (Signed)
Chief Complaint  Patient presents with  . Annual Exam    Well Male Steve Nolan is here for a complete physical.   His last physical was >1 year ago.  Current diet: in general, a "healthy" diet   Current exercise: Lifts weights  Weight trend: stable Seat belt? Yes.    Health maintenance Shingrix- No Colonoscopy- Yes- unsure exact date Tetanus- No HIV- Yes Hep C- Yes Prostate cancer screening- Yes   Past Medical History:  Diagnosis Date  . A-fib ([place])    20 years ago,went to cardiologist no treatment needed and no follow-up - dx. torn muscle.  . Chronic low back pain 11/11/2016  . Kidney stones     Past Surgical History:  Procedure Laterality Date  . EXTRACORPOREAL SHOCK WAVE LITHOTRIPSY Left 01/09/2017   Procedure: LEFT EXTRACORPOREAL SHOCK WAVE LITHOTRIPSY (ESWL);  Surgeon: Cleon Gustin, MD;  Location: [facility];  Service: Urology;  Laterality: Left;  . FUNCTIONAL ENDOSCOPIC SINUS SURGERY Bilateral 1998   deviated septum  . left shoulder surgery Left    arthoscopic   Medications  Current Outpatient Prescriptions on File Prior to Visit  Medication Sig Dispense Refill  . Cyanocobalamin (VITAMIN B 12 PO) Place 1 tablet under the tongue once a week.     . tadalafil (CIALIS) 5 MG tablet Take 5 mg by mouth daily.     Allergies Allergies  Allergen Reactions  . Sulfa Antibiotics Rash   Family History Family History  Problem Relation Age of Onset  . Hypertension Mother   . Diabetes Mother   . Heart disease Father   . Stroke Maternal Grandmother   . Hypertension Maternal Grandmother   . Drug abuse Maternal Grandmother   . Heart disease Maternal Grandfather   . Hypertension Maternal Grandfather   . Heart disease Paternal Grandfather     Review of Systems: Constitutional:  no unexpected change in weight Eye:  no recent significant change in vision Ear/Nose/Mouth/Throat:  Ears:  no tinnitus or hearing loss Nose/Mouth/Throat:  no complaints of nasal  congestion or bleeding, no sore throat and oral sores Cardiovascular:  no chest pain, no palpitations Respiratory:  no cough and no shortness of breath Gastrointestinal: +intermittent abdominal pain, no change in bowel habits, no nausea, vomiting, diarrhea, or constipation and no black or bloody stool GU:  Male: negative for dysuria, frequency, and incontinence and negative for prostate symptoms Musculoskeletal/Extremities: +LBP, +L shoulder pain; otherwise no pain, redness, or swelling of the joints Integumentary (Skin/Breast): +lesions on head (seeing derm), otherwise no abnormal skin lesions reported Neurologic:  no headaches, no numbness, tingling Endocrine: No weight changes Hematologic/Lymphatic:  no abnormal bleeding  Exam BP 118/72 (BP Location: Left Arm, Patient Position: Sitting, Cuff Size: Normal)   Pulse 80   Temp 98.2 F (36.8 C) (Oral)   Ht 5\' 9"  (1.753 m)   Wt 193 lb 8 oz (87.8 kg)   SpO2 96%   BMI 28.57 kg/m  General:  well developed, well nourished, in no apparent distress Skin:  no significant moles, warts, or growths Head:  no masses, lesions, or tenderness Eyes:  pupils equal and round, sclera anicteric without injection Ears:  canals without lesions, TMs shiny without retraction, no obvious effusion, no erythema Nose:  nares patent, septum midline, mucosa normal Throat/Pharynx:  lips and gingiva without lesion; tongue and uvula midline; non-inflamed pharynx; no exudates or postnasal drainage Neck: neck supple without adenopathy, thyromegaly, or masses Lungs:  clear to auscultation, breath sounds equal bilaterally, no respiratory  distress Cardio:  regular rate and rhythm without murmurs, heart sounds without clicks or rubs Abdomen:  abdomen soft, nontender; bowel sounds normal; no masses or organomegaly Genital (male): Nml penis, no lesions or discharge; testes present bilaterally without masses or tenderness Rectal: Deferred Musculoskeletal:  symmetrical muscle  groups noted without atrophy or deformity Extremities:  no clubbing, cyanosis, or edema, no deformities, no skin discoloration Neuro:  gait normal; deep tendon reflexes normal and symmetric Psych: well oriented with normal range of affect and appropriate judgment/insight  Assessment and Plan  Well adult exam - Plan: Hepatitis B surface antibody  Screen for colon cancer - Plan: Ambulatory referral to Gastroenterology  Elevated hemoglobin ([place]) - Plan: CBC w/Diff   Well 56 y.o. male. Counseled on diet and exercise. Clean up diet.  Counseled on risks and benefits of prostate cancer screening with PSA. The patient agreed to undergo testing, testing was neg. Immunizations, labs, and further orders as above. Check lipid panel in 3 mo.  Follow up in 1 year pending above. The patient voiced understanding and agreement to the plan.  [place], DO 01/22/17 9:10 AM

## 2017-01-22 NOTE — Patient Instructions (Signed)
Aim to do some physical exertion for 150 minutes per week. This is typically divided into 5 days per week, 30 minutes per day. The activity should be enough to get your heart rate up. Anything is better than nothing if you have time constraints.  Clean up the diet. Come in 3 mo for a fasting lab visit.   Follow up recommendations to follow pending the results of your labs.  If you do not hear anything about your referral in the next 1-2 weeks, call our office and ask for an update.  Let us know if you need anything.

## 2017-01-23 ENCOUNTER — Ambulatory Visit (INDEPENDENT_AMBULATORY_CARE_PROVIDER_SITE_OTHER): Payer: BC Managed Care – PPO | Admitting: Specialist

## 2017-01-23 ENCOUNTER — Ambulatory Visit (INDEPENDENT_AMBULATORY_CARE_PROVIDER_SITE_OTHER): Payer: BC Managed Care – PPO

## 2017-01-23 ENCOUNTER — Encounter (INDEPENDENT_AMBULATORY_CARE_PROVIDER_SITE_OTHER): Payer: Self-pay | Admitting: Specialist

## 2017-01-23 VITALS — BP 122/78 | HR 7 | Ht 69.0 in | Wt 195.0 lb

## 2017-01-23 DIAGNOSIS — M545 Low back pain: Secondary | ICD-10-CM

## 2017-01-23 DIAGNOSIS — M4726 Other spondylosis with radiculopathy, lumbar region: Secondary | ICD-10-CM

## 2017-01-23 LAB — HEPATITIS B SURFACE ANTIBODY,QUALITATIVE: Hep B S Ab: NONREACTIVE

## 2017-01-23 MED ORDER — DICLOFENAC SODIUM 1 % TD GEL: 4.0000 g | Freq: Four times a day (QID) | TRANSDERMAL | 2 refills | Status: DC

## 2017-01-23 NOTE — Progress Notes (Signed)
Office Visit Note   Patient: Steve Nolan           Date of Birth: 1960-08-28           MRN: [id number] Visit Date: 01/23/2017              Requested by: Shelda Pal, Bl[place],  [id number] PCP: Shelda Pal, DO   Assessment & Plan: Visit Diagnoses:  1. Low back pain, unspecified back pain laterality, unspecified chronicity, with sciatica presence unspecified     Plan: Avoid bending, stooping and avoid lifting weights greater than 10 lbs. Avoid prolong standing and walking. Avoid frequent bending and stooping  No lifting greater than 10 lbs. May use ice or moist heat for pain. Weight loss is of benefit. Will try voltaren gel transdermal and ask for EMG/NCV to assess for left L5 nerve findings.   Follow-Up Instructions: No Follow-up on file.   Orders:  Orders Placed This Encounter  Procedures  . XR Lumbar Spine 2-3 Views   No orders of the defined types were placed in this encounter.     Procedures: No procedures performed   Clinical Data: No additional findings.   Subjective: Chief Complaint  Patient presents with  . Lower Back - Pain    56 year old male with history of low back pain,  No injury, but onset of about a year ago. The pain on a scole one to ten it is not consistent. Some days not as bad. It's a nuscance. No pain with lying down, Sitting down for long periods or transitioning to get out of bed increases the pain. Feels like a pinched nerve. No numbness, No pins or needles. Bowel and bladder difficulty, had lithotripsy with four kidney stone left side Left upper buttock, SI area. No leg weakness, can walk a mile and does not have difficulty with walking. After a mile might have to sit to decrease the pressure. Not leaning on the cart. Not able to use motrin or naprosyn with history of a bleeding ulcer x 6. Had endoscopy and antiacid, not bacteria     Review of Systems  Constitutional:  Positive for activity change. Negative for appetite change, chills, diaphoresis, fatigue, fever and unexpected weight change.  HENT: Negative for congestion, dental problem, drooling, mouth sores, nosebleeds, postnasal drip, rhinorrhea, sinus pain, sinus pressure, sneezing, tinnitus, trouble swallowing and voice change.   Eyes: Negative.  Negative for photophobia, pain, discharge, redness, itching and visual disturbance.  Respiratory: Negative for cough, choking, chest tightness, shortness of breath and wheezing.   Cardiovascular: Negative for chest pain and leg swelling.  Gastrointestinal: Negative for abdominal distention, abdominal pain, anal bleeding, blood in stool, constipation, diarrhea, nausea, rectal pain and vomiting.  Endocrine: Negative.  Negative for cold intolerance, heat intolerance, polydipsia, polyphagia and polyuria.  Genitourinary: Positive for flank pain and hematuria. Negative for difficulty urinating, dysuria and enuresis.  Musculoskeletal: Negative for arthralgias, back pain and gait problem.  Skin: Negative.  Negative for color change, pallor, rash and wound.  Allergic/Immunologic: Negative.   Neurological: Negative for dizziness, seizures, syncope, facial asymmetry, speech difficulty, weakness, light-headedness, numbness and headaches.  Hematological: Negative.  Negative for adenopathy. Does not bruise/bleed easily.  Psychiatric/Behavioral: Negative.  Negative for agitation, behavioral problems, confusion, decreased concentration, dysphoric mood, hallucinations, self-injury, sleep disturbance and suicidal ideas. The patient is not nervous/anxious and is not hyperactive.      Objective: Vital Signs: BP 122/78 (BP Location:  Left Arm, Patient Position: Sitting)   Pulse (!) 7   Ht 5\' 9"  (1.753 m)   Wt 195 lb (88.5 kg)   BMI 28.80 kg/m   Physical Exam  Constitutional: He is oriented to person, place, and time. He appears well-developed and well-nourished.  HENT:  Head:  Normocephalic and atraumatic.  Eyes: Pupils are equal, round, and reactive to light. EOM are normal. Right eye exhibits no discharge. Left eye exhibits no discharge.  Neck: Normal range of motion. Neck supple. No JVD present. No tracheal deviation present. No thyromegaly present.  Cardiovascular: Exam reveals no gallop and no friction rub.   Pulmonary/Chest: Effort normal and breath sounds normal. No stridor. No respiratory distress. He has no wheezes. He has no rales. He exhibits no tenderness.  Abdominal: Soft. Bowel sounds are normal. He exhibits no distension and no mass. There is no tenderness. There is no guarding.  Musculoskeletal: He exhibits no edema, tenderness or deformity.  Lymphadenopathy:    He has no cervical adenopathy.  Neurological: He is alert and oriented to person, place, and time.  Skin: Skin is warm and dry.  Psychiatric: He has a normal mood and affect. His behavior is normal. Judgment and thought content normal.    Back Exam   Tenderness  The patient is experiencing tenderness in the lumbar.  Range of Motion  Extension: abnormal  Flexion: abnormal  Lateral Bend Right: abnormal  Lateral Bend Left: abnormal  Rotation Right: abnormal  Rotation Left: abnormal   Muscle Strength  Right Quadriceps:  5/5  Left Quadriceps:  5/5  Right Hamstrings:  5/5  Left Hamstrings:  5/5   Tests  Straight leg raise right: negative Straight leg raise left: negative  Reflexes  Patellar: normal Achilles: normal Babinski's sign: normal   Other  Toe Walk: normal Heel Walk: normal Sensation: normal Gait: normal  Scars: absent  Comments:  Left EHL      Specialty Comments:  No specialty comments available.  Imaging: No results found.   PMFS History: Patient Active Problem List   Diagnosis Date Noted  . Chronic left shoulder pain 01/14/2017  . Chronic low back pain 11/11/2016   Past Medical History:  Diagnosis Date  . A-fib ([place])    20 years ago,went to  cardiologist no treatment needed and no follow-up - dx. torn muscle.  . Chronic low back pain 11/11/2016  . Kidney stones     Family History  Problem Relation Age of Onset  . Hypertension Mother   . Diabetes Mother   . Heart disease Father   . Stroke Maternal Grandmother   . Hypertension Maternal Grandmother   . Drug abuse Maternal Grandmother   . Heart disease Maternal Grandfather   . Hypertension Maternal Grandfather   . Heart disease Paternal Grandfather     Past Surgical History:  Procedure Laterality Date  . EXTRACORPOREAL SHOCK WAVE LITHOTRIPSY Left 01/09/2017   Procedure: LEFT EXTRACORPOREAL SHOCK WAVE LITHOTRIPSY (ESWL);  Surgeon: Cleon Gustin, MD;  Location: [facility];  Service: Urology;  Laterality: Left;  . FUNCTIONAL ENDOSCOPIC SINUS SURGERY Bilateral 1998   deviated septum  . left shoulder surgery Left    arthoscopic   Social History   Occupational History  . Not on file.   Social History Main Topics  . Smoking status: Never Smoker  . Smokeless tobacco: Never Used  . Alcohol use No  . Drug use: No  . Sexual activity: Not on file

## 2017-01-23 NOTE — Patient Instructions (Addendum)
Avoid bending, stooping and avoid lifting weights greater than 10 lbs. Avoid prolong standing and walking. Avoid frequent bending and stooping  No lifting greater than 10 lbs. May use ice or moist heat for pain. Weight loss is of benefit. Will try voltaren gel transdermal and ask for EMG/NCV to assess for left L5 nerve findings.

## 2017-01-24 ENCOUNTER — Ambulatory Visit (HOSPITAL_COMMUNITY)
Admission: RE | Admit: 2017-01-24 | Discharge: 2017-01-24 | Disposition: A | Discharge status: Home / Self Care | Payer: BC Managed Care – PPO | Source: Ambulatory Visit | Attending: Physical Medicine & Rehabilitation | Admitting: Physical Medicine & Rehabilitation

## 2017-01-24 ENCOUNTER — Other Ambulatory Visit: Payer: Self-pay | Admitting: Physical Medicine & Rehabilitation

## 2017-01-24 DIAGNOSIS — M948X1 Other specified disorders of cartilage, shoulder: Secondary | ICD-10-CM | POA: Insufficient documentation

## 2017-01-24 DIAGNOSIS — G8929 Other chronic pain: Secondary | ICD-10-CM | POA: Insufficient documentation

## 2017-01-24 DIAGNOSIS — Z1389 Encounter for screening for other disorder: Secondary | ICD-10-CM | POA: Diagnosis not present

## 2017-01-24 DIAGNOSIS — M25512 Pain in left shoulder: Principal | ICD-10-CM

## 2017-01-24 IMAGING — DX DG ORBITS FOR FOREIGN BODY
2 series · 2 of 2 positions shown · non-contrast
Comparison: None.

CLINICAL DATA: Metal working/exposure; clearance prior to MRI

EXAM:
ORBITS FOR FOREIGN BODY - 2 VIEW

[orbits waters (1 of 2)]
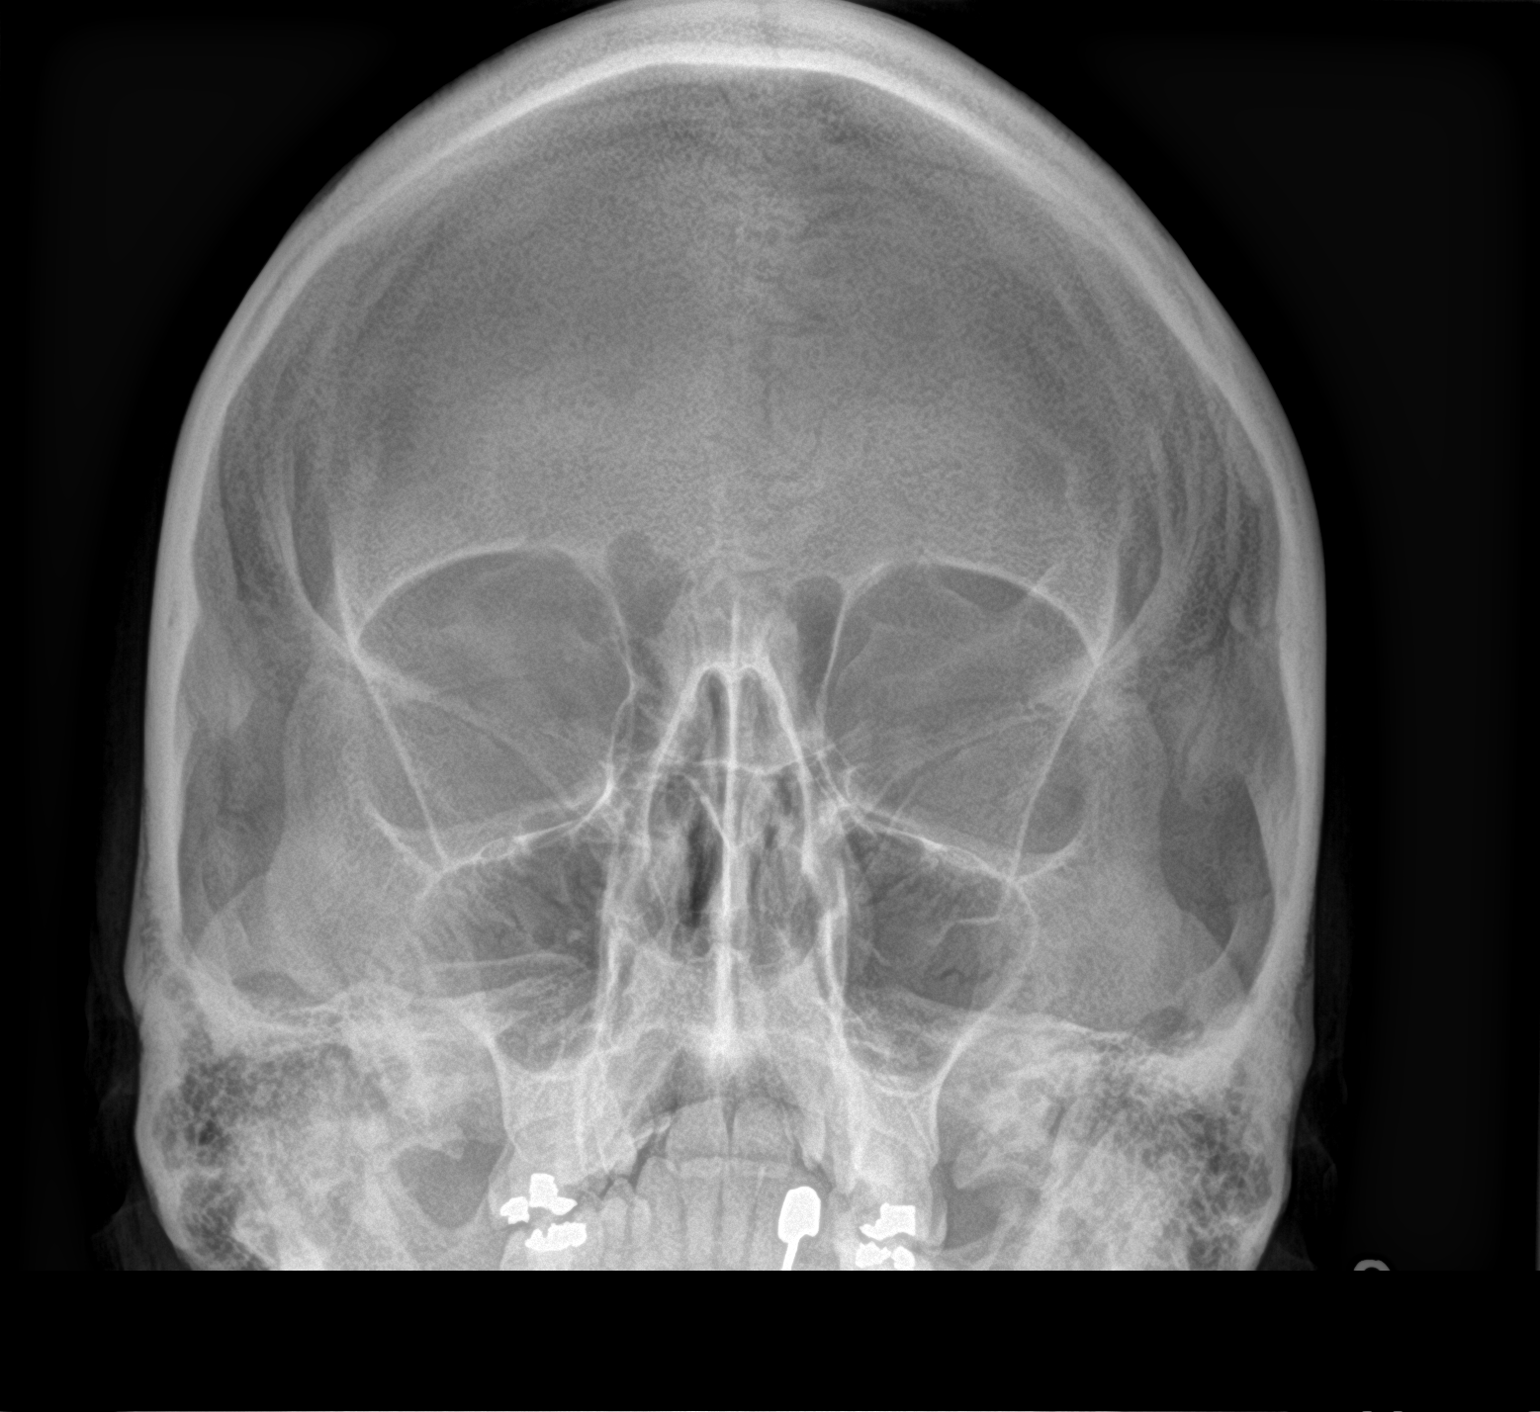

[orbits waters (2 of 2)]
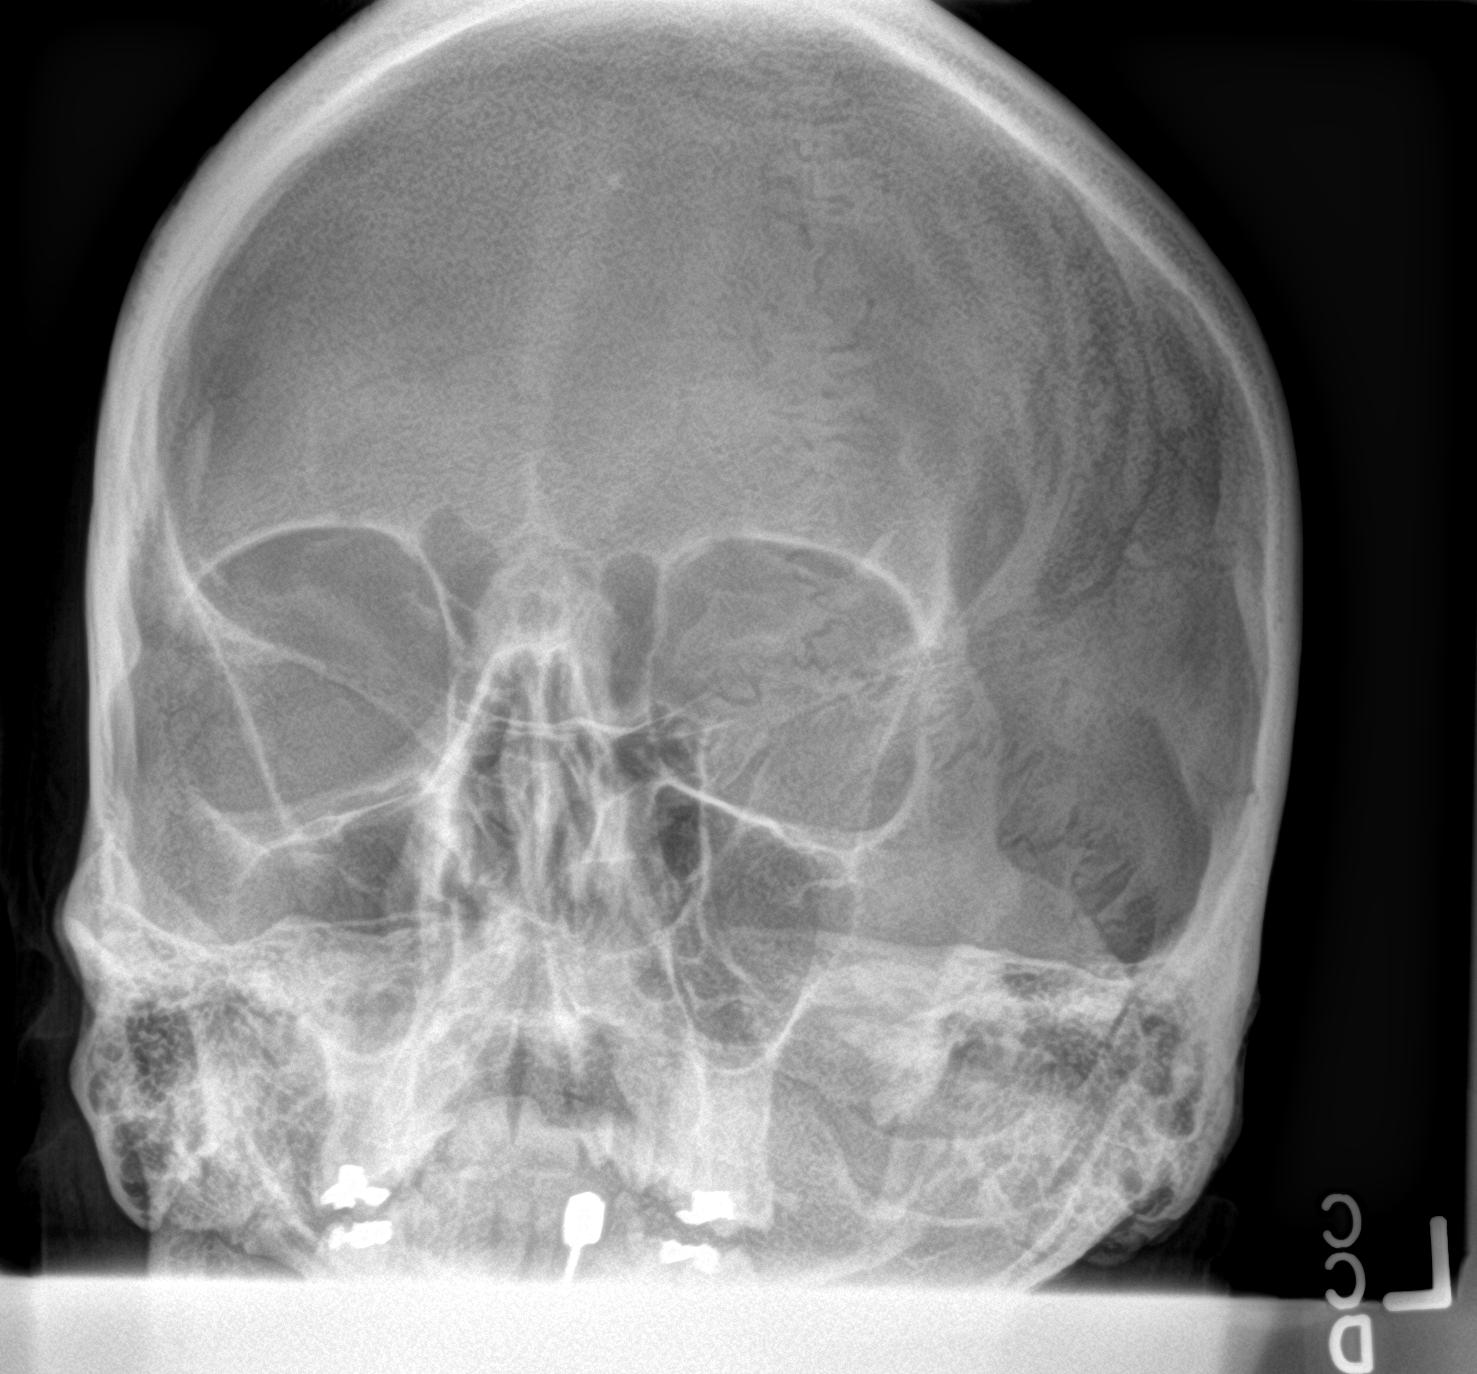

[2 of 2 positions shown; findings below may reference images not displayed]

FINDINGS: There is no evidence of metallic foreign body within the orbits. No
significant bone abnormality identified.
IMPRESSION: No evidence of metallic foreign body within the orbits.

## 2017-01-24 MED ORDER — LIDOCAINE HCL 1 % IJ SOLN
INTRAMUSCULAR | Status: AC
  Administered 2017-01-24: 15:00:00
  Filled 2017-01-24: qty 20

## 2017-01-24 MED ORDER — GADOBENATE DIMEGLUMINE 529 MG/ML IV SOLN
5.0000 mL | Freq: Once | INTRAVENOUS | Status: AC | PRN
  Administered 2017-01-24: 5 mL via INTRAVENOUS

## 2017-01-24 MED ORDER — IOPAMIDOL (ISOVUE-300) INJECTION 61%
INTRAVENOUS | Status: AC
  Administered 2017-01-24: 14:00:00
  Filled 2017-01-24: qty 50

## 2017-01-28 ENCOUNTER — Ambulatory Visit (INDEPENDENT_AMBULATORY_CARE_PROVIDER_SITE_OTHER): Payer: BC Managed Care – PPO

## 2017-01-28 ENCOUNTER — Encounter: Payer: BC Managed Care – PPO | Attending: Physical Medicine & Rehabilitation

## 2017-01-28 ENCOUNTER — Encounter: Payer: Self-pay | Admitting: Physical Medicine & Rehabilitation

## 2017-01-28 ENCOUNTER — Ambulatory Visit (HOSPITAL_BASED_OUTPATIENT_CLINIC_OR_DEPARTMENT_OTHER): Payer: BC Managed Care – PPO | Admitting: Physical Medicine & Rehabilitation

## 2017-01-28 VITALS — BP 121/85 | HR 68

## 2017-01-28 DIAGNOSIS — M545 Low back pain: Secondary | ICD-10-CM | POA: Diagnosis not present

## 2017-01-28 DIAGNOSIS — M899 Disorder of bone, unspecified: Secondary | ICD-10-CM | POA: Diagnosis not present

## 2017-01-28 DIAGNOSIS — G8929 Other chronic pain: Secondary | ICD-10-CM | POA: Insufficient documentation

## 2017-01-28 DIAGNOSIS — M949 Disorder of cartilage, unspecified: Secondary | ICD-10-CM | POA: Diagnosis not present

## 2017-01-28 DIAGNOSIS — Z8249 Family history of ischemic heart disease and other diseases of the circulatory system: Secondary | ICD-10-CM | POA: Diagnosis not present

## 2017-01-28 DIAGNOSIS — Z23 Encounter for immunization: Secondary | ICD-10-CM | POA: Diagnosis not present

## 2017-01-28 DIAGNOSIS — M19012 Primary osteoarthritis, left shoulder: Secondary | ICD-10-CM | POA: Insufficient documentation

## 2017-01-28 DIAGNOSIS — Z87442 Personal history of urinary calculi: Secondary | ICD-10-CM | POA: Insufficient documentation

## 2017-01-28 DIAGNOSIS — Z833 Family history of diabetes mellitus: Secondary | ICD-10-CM | POA: Insufficient documentation

## 2017-01-28 DIAGNOSIS — M25512 Pain in left shoulder: Secondary | ICD-10-CM | POA: Insufficient documentation

## 2017-01-28 NOTE — Patient Instructions (Signed)
Traumatic Shoulder Instability Rehab After Surgery Ask your health care provider which exercises are safe for you. Do exercises exactly as told by your health care provider and adjust them as directed. It is normal to feel mild stretching, pulling, tightness, or discomfort as you do these exercises, but you should stop right away if you feel sudden pain or your pain gets worse.Do not begin these exercises until told by your health care provider. Stretching and range of motion exercises These exercises warm up your muscles and joints and improve the movement and flexibility of your shoulder. These exercises also help to relieve pain, numbness, and tingling. Exercise A: Pendulum  1. Stand near a wall or a surface that you can hold onto for balance. 2. Bend at the waist and let your left / right arm hang straight down. Use your other arm to keep your balance. 3. Relax your arm and shoulder muscles, and move your hips and your trunk so your left / right arm swings freely. Your arm should swing because of the motion of your body, not because you are using your arm or shoulder muscles. 4. Keep moving so your arm swings in the following directions, as told by your health care provider: ? Side to side. ? Forward and backward. ? In clockwise and counterclockwise circles. Repeat __________ times, or for __________ seconds per direction. Complete this exercise __________ times a day. Exercise B: Shoulder flexion, active-assisted  1. Lie on your back. You may bend your knees for comfort. 2. Hold a broomstick, a cane, or a similar object so your hands are about shoulder-width apart. Your palms should face toward your feet. 3. Raise your left / right arm over your head and behind your head, toward the floor. Use your other hand to help you do this. Stop when you feel a gentle stretch in your shoulder, or when you reach the angle that is recommended by your health care provider. 4. Hold for __________  seconds. 5. Use the broomstick and your other arm to help you return your left / right arm to the starting position. Repeat __________ times. Complete this exercise __________ times a day. Exercise C: Active elbow extension 1. Sit in a chair without armrests, or stand. With your left / right elbow at your side, bend your elbow to an "L" shape (90 degrees). 2. Keep your left / right elbow at your side and straighten your elbow as much as you can, so your hand moves toward the floor. 3. Hold for __________ seconds. 4. Slowly return to the starting position. Repeat __________ times. Complete this exercise __________ times a day. Strengthening exercises These exercises build strength and endurance in your shoulder. Endurance is the ability to use your muscles for a long time, even after they get tired. Exercise D: Scapular retraction, isometric 1. Sit in a stable chair without armrests, or stand. 2. Squeeze your shoulder blades together and move your elbows slightly behind you. Do not shrug your shoulders. 3. Hold for __________ seconds. 4. Slowly return to the starting position. Repeat __________ times. Complete this exercise __________ times a day. Exercise E: Shoulder flexion, isometric 1. Stand or sit about 4-6 inches (10-15 cm) away from a wall, and face the wall. 2. Gently make a fist and place your left / right hand on the wall so the top of your thumb and the side of your index finger touch the wall. 3. With your left / right elbow straight, gently press the top of your  fist into the wall. Gradually increase the pressure until you are pressing as hard as you can without shrugging your shoulder. 4. Hold for __________ seconds. 5. Slowly release the tension, and relax your muscles completely before you repeat the exercise. Repeat __________ times. Complete this exercise __________ times a day. Exercise F: Shoulder abduction, isometric  1. Stand or sit about 4-6 inches (10-15 cm) away from  a wall with your right/left side facing the wall. 2. Bend your left / right elbow and gently press your elbow into the wall as if you are trying to move your arm out to your side. Gradually increase the pressure until you are pressing as hard as you can without shrugging your shoulder. 3. Hold for __________ seconds. 4. Slowly release the tension, and relax your muscles completely before you repeat the exercise. Repeat __________ times. Complete this exercise __________ times a day. Exercise G: External rotation, isometric  1. Stand or sit in a doorway, facing the door frame. 2. Bend your left / right elbow and place the back of your wrist against the door frame. Only the back of your wrist should be touching the frame. Keep your upper arm at your side. 3. Gently press your wrist into the door frame, as if you are trying to push your arm away from your abdomen. ? Avoid shrugging your shoulder while you press your wrist into the door frame. Keep your shoulder blade tucked down toward the middle of your back. 4. Hold for __________ seconds. 5. Slowly release the tension, and relax your muscles completely before you repeat the exercise. Repeat __________ times. Complete this exercise __________ times a day. Exercise H: Internal rotation, isometric  1. Stand or sit in a door frame, facing the door frame. 2. Bend your left / right elbow and place the palm of your hand against the door frame. Only your palm should be touching the frame. Keep your upper arm at your side. 3. Gently press your hand into the door frame, as if you are trying to push your arm toward your abdomen. Do not let your wrist bend. ? Avoid shrugging your shoulder while you press your hand into the door frame. Keep your shoulder blade tucked down toward the middle of your back. 4. Hold for __________ seconds. 5. Slowly release the tension, and relax your muscles completely before you repeat the exercise. Repeat __________ times.  Complete this exercise __________ times a day. This information is not intended to replace advice given to you by your health care provider. Make sure you discuss any questions you have with your health care provider. Document Released: 03/11/2005 Document Revised: 11/16/2015 Document Reviewed: 03/25/2015 Elsevier Interactive Patient Education  [employer].

## 2017-01-28 NOTE — Progress Notes (Signed)
Pre visit review using our clinic tool,if applicable. No additional management support is needed unless otherwise documented below in the visit note.   Patient in for HepB injection # 1 or 3 per order from [facility] dated 01/22/17.  Given 0.5ml IM Right Deltoid. Patient toleated well. Appointment scheduled for next injection.

## 2017-01-28 NOTE — Progress Notes (Signed)
Subjective:    Patient ID: Steve Nolan, male    DOB: 07-Jan-1961, 56 y.o.   MRN: [id number]  HPI  56 year old male with complaints of left shoulder pain times 1 year.  History of left shoulder surgery in the 1990s and had been told he had a SLAP lesion.  He did have arthroscopic debridement of the rotator cuff. He has been doing some heavier bench pressing this year and noted increasing pain.  He has tried resting but not for more than a couple weeks.  He gets pain with bench press as well as [employer] press as well as curls.  We went over activity modification bent over flies. He underwent MRI and is back to discuss results. He has had no new symptoms no trauma.  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 General activity 4 Relation with others . Enjoyment of life 4 What TIME of day is your pain at its worst? with physical therapy with shoulders Sleep (in general) Good  Pain is worse with: some activites Pain improves with: therapy/exercise Relief from Meds: N/A  Mobility walk without assistance ability to climb steps?  yes do you drive?  yes  Function employed # of hrs/week 40  Neuro/Psych No problems in this area  Prior Studies Any changes since last visit?  no MR ARTHROGRAM OF THE LEFT SHOULDER  TECHNIQUE: Multiplanar, multisequence MR imaging of the left shoulder was performed following the administration of intra-articular contrast.  CONTRAST:  See Injection Documentation.  COMPARISON:  Radiographs dated 01/14/2017  FINDINGS: Rotator cuff: Intact.  Muscles: Normal.  No atrophy or edema.  Biceps long head: Properly located and intact.  Acromioclavicular Joint: Minimal degenerative changes of the AC joint. Type 2 acromion. No bursitis.  Glenohumeral Joint: Small focal area of full-thickness cartilage loss  of the central portion of the humeral head best seen on images 8 and 9 of series 10. Otherwise normal.  Labrum: Normal.  Bones: No significant abnormalities.  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 No other significant abnormalities.   Electronically Signed   By: Lorriane Shire M.D.   On: 01/24/2017 15:22 Physicians involved in your care Any changes since last visit?  no   Family History  Problem Relation Age of Onset  . Hypertension Mother   . Diabetes Mother   . Heart disease Father   . Stroke Maternal Grandmother   . Hypertension Maternal Grandmother   . Drug abuse Maternal Grandmother   . Heart disease Maternal Grandfather   . Hypertension Maternal Grandfather   . Heart disease Paternal Grandfather    Social History   Socioeconomic History  . Marital status: Divorced    Spouse name: None  . Number of children: None  . Years of education: None  . Highest education level: None  Social Needs  . Financial resource strain: None  . Food insecurity - worry: None  . Food insecurity - inability: None  . Transportation needs - medical: None  . Transportation needs - non-medical: None  Occupational History  . None  Tobacco Use  . Smoking status: Never Smoker  . Smokeless tobacco: Never Used  Substance and Sexual Activity  . Alcohol use: No  . Drug use: No  . Sexual activity: None  Other Topics Concern  . None  Social History Narrative  .  None   Past Surgical History:  Procedure Laterality Date  . FUNCTIONAL ENDOSCOPIC SINUS SURGERY Bilateral 1998   deviated septum  . left shoulder surgery Left    arthoscopic   Past Medical History:  Diagnosis Date  . A-fib ([place])    20 years ago,went to cardiologist no treatment needed and no follow-up - dx. torn muscle.  . Chronic low back pain 11/11/2016  . Kidney stones    BP 121/85   Pulse 68   SpO2 95%   Opioid Risk Score:  6 Fall Risk Score:  `1  Depression  screen PHQ 2/9  Depression screen [facility] 2/9 01/28/2017 01/14/2017  Decreased Interest 0 0  Down, Depressed, Hopeless 0 0  PHQ - 2 Score 0 0  Altered sleeping - 0  Tired, decreased energy - 0  Change in appetite - 0  Feeling bad or failure about yourself  - 0  Trouble concentrating - 0  Moving slowly or fidgety/restless - 0  Suicidal thoughts - 0  PHQ-9 Score - 0  Difficult doing work/chores - Not difficult at all      Review of Systems     Objective:   Physical Exam  Constitutional: He is oriented to person, place, and time. He appears well-developed and well-nourished. No distress.  HENT:  Head: Normocephalic and atraumatic.  Eyes: Conjunctivae and EOM are normal. Pupils are equal, round, and reactive to light.  Musculoskeletal:  No pain with shoulder range of motion. Patient complains of pain in the posterior subacromial area. No pain over the acromioclavicular area or pain with shoulder adduction.  Neurological: He is alert and oriented to person, place, and time. Coordination and gait normal.  Motor strength is 5/5 bilateral deltoid bicep tricep grip  Skin: He is not diaphoretic.  Psychiatric: He has a normal mood and affect. His behavior is normal. Judgment and thought content normal.  Nursing note and vitals reviewed.         Assessment & Plan:  1.  Left shoulder pain no signs of rotator cuff pathology.  The acromioclavicular arthritic changes do not appear to be symptomatic.  It is possible to have referred pain from the [facility] joint posteriorly but this is not common. We discussed that while there is no MRI evidence of SLAP lesion there could be a small tear that is only visible on shoulder arthroscopy. We discussed exercises that include working on rotator cuff musculature to help with rebalancing forces as well as stabilizing the left shoulder prior to resuming any heavier weight lifting. We discussed physical therapy however the patient states it would be difficult  with his work schedule to accommodate this. No medications will be prescribed. We discussed that a glenohumeral injection may be helpful at least to quiet things down, to what extent the full-thickness chondral lesion is giving him symptoms it is difficult to say.

## 2017-02-21 ENCOUNTER — Encounter: Payer: Self-pay | Admitting: Family Medicine

## 2017-02-27 ENCOUNTER — Ambulatory Visit (INDEPENDENT_AMBULATORY_CARE_PROVIDER_SITE_OTHER): Payer: BC Managed Care – PPO

## 2017-02-27 DIAGNOSIS — Z23 Encounter for immunization: Secondary | ICD-10-CM | POA: Diagnosis not present

## 2017-04-03 ENCOUNTER — Encounter: Payer: Self-pay | Admitting: Family Medicine

## 2017-07-02 ENCOUNTER — Ambulatory Visit (INDEPENDENT_AMBULATORY_CARE_PROVIDER_SITE_OTHER): Admitting: Family Medicine

## 2017-07-02 ENCOUNTER — Encounter: Payer: Self-pay | Admitting: Family Medicine

## 2017-07-02 VITALS — BP 118/80 | HR 80 | Temp 97.9°F | Ht 69.0 in | Wt 194.1 lb

## 2017-07-02 DIAGNOSIS — J0110 Acute frontal sinusitis, unspecified: Secondary | ICD-10-CM

## 2017-07-02 MED ORDER — DOXYCYCLINE HYCLATE 100 MG PO TABS: 100.0000 mg | ORAL_TABLET | Freq: Two times a day (BID) | ORAL | 0 refills | Status: AC

## 2017-07-02 MED ORDER — CEFTRIAXONE SODIUM 1 G IJ SOLR
1.0000 g | Freq: Once | INTRAMUSCULAR | Status: AC
  Administered 2017-07-02: 1 g via INTRAMUSCULAR

## 2017-07-02 NOTE — Telephone Encounter (Signed)
Have him schedule an appt please. TY.

## 2017-07-02 NOTE — Progress Notes (Signed)
Pre visit review using our clinic review tool, if applicable. No additional management support is needed unless otherwise documented below in the visit note. 

## 2017-07-02 NOTE — Progress Notes (Signed)
Chief Complaint  Patient presents with  . Sinusitis    Charlestine Massed here for URI complaints.  Duration: 2 weeks  Associated symptoms: sinus congestion, sinus pain and rhinorrhea Denies: itchy watery eyes, ear fullness, ear pain, ear drainage, sore throat, wheezing, shortness of breath, myalgia and fevers Treatment to date: saline, olive leaf extract, echinacea, colloidal silver, elderberry Sick contacts: No  ROS:  Const: Denies fevers HEENT: As noted in HPI Lungs: No SOB  Past Medical History:  Diagnosis Date  . A-fib ([place])    20 years ago,went to cardiologist no treatment needed and no follow-up - dx. torn muscle.  . Chronic low back pain 11/11/2016  . Kidney stones    Family History  Problem Relation Age of Onset  . Hypertension Mother   . Diabetes Mother   . Heart disease Father   . Stroke Maternal Grandmother   . Hypertension Maternal Grandmother   . Drug abuse Maternal Grandmother   . Heart disease Maternal Grandfather   . Hypertension Maternal Grandfather   . Heart disease Paternal Grandfather     BP 118/80 (BP Location: Left Arm, Patient Position: Sitting, Cuff Size: Normal)   Pulse 80   Temp 97.9 F (36.6 C) (Oral)   Ht 5\' 9"  (1.753 m)   Wt 194 lb 2 oz (88.1 kg)   SpO2 95%   BMI 28.67 kg/m  General: Awake, alert, appears stated age HEENT: AT, [place], ears patent b/l and TM's neg, nares patent w/o discharge, pharynx pink and without exudates, MMM Neck: No masses or asymmetry Heart: RRR Lungs: CTAB, no accessory muscle use Psych: Age appropriate judgment and insight, normal mood and affect  Acute frontal sinusitis, recurrence not specified - Plan: cefTRIAXone (ROCEPHIN) injection 1 g  Orders as above. Pt reports after seeing multiple specialists, this was what has worked in past. Will try this with contingency plan of doxy given 2 week duration. Continue supportive care. F/u prn. Pt voiced understanding and agreement to the plan.  [place], DO 07/02/17 4:39 PM

## 2017-07-02 NOTE — Patient Instructions (Addendum)
Continue your home regimen with natural issues.   Don't fill the antibiotic (doxycycline) if you start to feel better. This is our contingency plan.   Things to look out for: fevers, worsening signs/symptoms, pus draining from your nose.  Let us know if you need anything.

## 2017-08-08 ENCOUNTER — Ambulatory Visit (INDEPENDENT_AMBULATORY_CARE_PROVIDER_SITE_OTHER)

## 2017-08-08 DIAGNOSIS — Z23 Encounter for immunization: Secondary | ICD-10-CM | POA: Diagnosis not present

## 2017-08-08 NOTE — Progress Notes (Addendum)
Pre visit review using our clinic review tool, if applicable. No additional management support is needed unless otherwise documented below in the visit note.  Pt here today for 3rd Hep B dosage. 46mL injected into L deltoid. Pt tolerated injection well.  I am okay with administration of the hepatitis B vaccine.  Mackie Pai, PA-C

## 2018-01-23 ENCOUNTER — Encounter: Admitting: Family Medicine

## 2018-02-02 ENCOUNTER — Other Ambulatory Visit: Payer: Self-pay | Admitting: Family Medicine

## 2018-02-02 DIAGNOSIS — Z125 Encounter for screening for malignant neoplasm of prostate: Secondary | ICD-10-CM

## 2018-02-02 DIAGNOSIS — Z0000 Encounter for general adult medical examination without abnormal findings: Secondary | ICD-10-CM

## 2018-02-04 ENCOUNTER — Other Ambulatory Visit (INDEPENDENT_AMBULATORY_CARE_PROVIDER_SITE_OTHER)

## 2018-02-04 DIAGNOSIS — Z125 Encounter for screening for malignant neoplasm of prostate: Secondary | ICD-10-CM | POA: Diagnosis not present

## 2018-02-04 DIAGNOSIS — Z0000 Encounter for general adult medical examination without abnormal findings: Secondary | ICD-10-CM | POA: Diagnosis not present

## 2018-02-04 LAB — COMPREHENSIVE METABOLIC PANEL
ALT: 27 U/L (ref 0–53)
AST: 24 U/L (ref 0–37)
Albumin: 4.5 g/dL (ref 3.5–5.2)
Alkaline Phosphatase: 61 U/L (ref 39–117)
BUN: 15 mg/dL (ref 6–23)
CO2: 30 mEq/L (ref 19–32)
CREATININE: 1.01 mg/dL (ref 0.40–1.50)
Calcium: 9.2 mg/dL (ref 8.4–10.5)
Chloride: 103 mEq/L (ref 96–112)
GFR: 80.47 mL/min (ref >60.00)
GLUCOSE: 97 mg/dL (ref 70–99)
Potassium: 3.9 mEq/L (ref 3.5–5.1)
Sodium: 140 mEq/L (ref 135–145)
Total Bilirubin: 0.7 mg/dL (ref 0.2–1.2)
Total Protein: 6.7 g/dL (ref 6.0–8.3)

## 2018-02-04 LAB — LIPID PANEL
CHOLESTEROL: 193 mg/dL (ref 0–200)
HDL: 45.4 mg/dL (ref >39.00)
LDL CALC: 113 mg/dL — AB (ref 0–99)
NONHDL: 147.37
Total CHOL/HDL Ratio: 4
Triglycerides: 171 mg/dL — ABNORMAL HIGH (ref 0.0–149.0)
VLDL: 34.2 mg/dL (ref 0.0–40.0)

## 2018-02-04 LAB — PSA: PSA: 0.91 ng/mL (ref 0.10–4.00)

## 2018-02-06 ENCOUNTER — Ambulatory Visit (INDEPENDENT_AMBULATORY_CARE_PROVIDER_SITE_OTHER): Admitting: Family Medicine

## 2018-02-06 ENCOUNTER — Encounter: Payer: Self-pay | Admitting: Family Medicine

## 2018-02-06 VITALS — BP 132/86 | HR 76 | Temp 97.5°F | Ht 69.0 in | Wt 197.0 lb

## 2018-02-06 DIAGNOSIS — Z1211 Encounter for screening for malignant neoplasm of colon: Secondary | ICD-10-CM

## 2018-02-06 DIAGNOSIS — Z0000 Encounter for general adult medical examination without abnormal findings: Secondary | ICD-10-CM | POA: Diagnosis not present

## 2018-02-06 MED ORDER — TADALAFIL 5 MG PO TABS: 5.0000 mg | ORAL_TABLET | Freq: Every day | ORAL | 2 refills | Status: DC | PRN

## 2018-02-06 NOTE — Progress Notes (Signed)
Chief Complaint  Patient presents with  . Annual Exam    Well Male Steve Nolan is here for a complete physical.   His last physical was >1 year ago.  Current diet: in general, a "healthy" diet.  Current exercise: running, lifting weights Weight trend: stable Daytime fatigue? No. Seat belt? Yes.    Health maintenance Shingrix- No Colonoscopy- Due Tetanus- Yes HIV- Yes Hep C- Yes   Past Medical History:  Diagnosis Date  . A-fib ([place])    20 years ago,went to cardiologist no treatment needed and no follow-up - dx. torn muscle.  . Chronic low back pain 11/11/2016  . Kidney stones       Past Surgical History:  Procedure Laterality Date  . EXTRACORPOREAL SHOCK WAVE LITHOTRIPSY Left 01/09/2017   Procedure: LEFT EXTRACORPOREAL SHOCK WAVE LITHOTRIPSY (ESWL);  Surgeon: Cleon Gustin, MD;  Location: [facility];  Service: Urology;  Laterality: Left;  . FUNCTIONAL ENDOSCOPIC SINUS SURGERY Bilateral 1998   deviated septum  . left shoulder surgery Left    arthoscopic    Medications  Current Outpatient Medications on File Prior to Visit  Medication Sig Dispense Refill  . Cyanocobalamin (VITAMIN B 12 PO) Place 1 tablet under the tongue once a week.      Allergies Allergies  Allergen Reactions  . Sulfa Antibiotics Rash    Family History Family History  Problem Relation Age of Onset  . Hypertension Mother   . Diabetes Mother   . Heart disease Father   . Stroke Maternal Grandmother   . Hypertension Maternal Grandmother   . Drug abuse Maternal Grandmother   . Heart disease Maternal Grandfather   . Hypertension Maternal Grandfather   . Heart disease Paternal Grandfather     Review of Systems: Constitutional:  no fevers Eye:  no recent significant change in vision Ear/Nose/Mouth/Throat:  Ears:  no hearing loss Nose/Mouth/Throat:  no complaints of nasal congestion, no sore throat Cardiovascular:  no chest pain, no palpitations Respiratory:  no cough and no  shortness of breath Gastrointestinal:  +epigastric abdominal pain, no change in bowel habits GU:  Male: negative for dysuria, frequency, and incontinence and negative for prostate symptoms Musculoskeletal/Extremities: +chronic back and L shoulder pain; otherwise no pain, redness, or swelling of the joints Integumentary (Skin/Breast):  no abnormal skin lesions reported Neurologic:  no headaches Endocrine: No unexpected weight changes Hematologic/Lymphatic:  no abnormal bleeding  Exam BP 132/86 (BP Location: Right Arm, Patient Position: Sitting, Cuff Size: Normal)   Pulse 76   Temp (!) 97.5 F (36.4 C) (Oral)   Ht 5\' 9"  (1.753 m)   Wt 197 lb (89.4 kg)   SpO2 96%   BMI 29.09 kg/m  General:  well developed, well nourished, in no apparent distress Skin:  no significant moles, warts, or growths Head:  no masses, lesions, or tenderness Eyes:  pupils equal and round, sclera anicteric without injection Ears:  canals without lesions, TMs shiny without retraction, no obvious effusion, no erythema Nose:  nares patent, septum midline, mucosa normal Throat/Pharynx:  lips and gingiva without lesion; tongue and uvula midline; non-inflamed pharynx; no exudates or postnasal drainage Neck: neck supple without adenopathy, thyromegaly, or masses Lungs:  clear to auscultation, breath sounds equal bilaterally, no respiratory distress Cardio:  regular rate and rhythm, no LE edema, no bruits Abdomen:  abdomen soft, nontender; bowel sounds normal; no masses or organomegaly Genital (male): Nml penis, no lesions or discharge; testes present bilaterally without masses or tenderness Rectal: Deferred Musculoskeletal:  symmetrical muscle groups noted without atrophy or deformity Extremities:  no clubbing, cyanosis, or edema, no deformities, no skin discoloration Neuro:  gait normal; deep tendon reflexes normal and symmetric Psych: well oriented with normal range of affect and appropriate  judgment/insight  Assessment and Plan  Well adult exam  Screen for colon cancer - Plan: Ambulatory referral to Gastroenterology   Well 57 y.o. male. Counseled on diet and exercise. Counseled on risks and benefits of prostate cancer screening with PSA. The patient had already agreed to undergo testing. Info for Shingrix given. We are out today, and he would like to think about it.  Immunizations, labs, and further orders as above. Follow up in 1 yr or prn. The patient voiced understanding and agreement to the plan.  [place], DO 02/06/18 3:44 PM

## 2018-02-06 NOTE — Progress Notes (Signed)
Pre visit review using our clinic review tool, if applicable. No additional management support is needed unless otherwise documented below in the visit note. 

## 2018-02-06 NOTE — Patient Instructions (Addendum)
Keep the diet clean and stay active.  If you do not hear anything about your referral in the next 1-2 weeks, call our office and ask for an update.  The new Shingrix vaccine (for shingles) is a 2 shot series. It can make people feel low energy, achy and almost like they have the flu for 48 hours after injection. Please plan accordingly when deciding on when to get this shot. Call our office for a nurse visit appointment to get this. The second shot of the series is less severe regarding the side effects, but it still lasts 48 hours.   Let us know if you need anything.

## 2018-04-07 ENCOUNTER — Encounter: Payer: Self-pay | Admitting: Family Medicine

## 2018-08-24 ENCOUNTER — Other Ambulatory Visit (INDEPENDENT_AMBULATORY_CARE_PROVIDER_SITE_OTHER)

## 2018-08-24 ENCOUNTER — Other Ambulatory Visit: Payer: Self-pay

## 2018-08-24 ENCOUNTER — Telehealth (INDEPENDENT_AMBULATORY_CARE_PROVIDER_SITE_OTHER): Admitting: Gastroenterology

## 2018-08-24 ENCOUNTER — Encounter: Payer: Self-pay | Admitting: Gastroenterology

## 2018-08-24 VITALS — Ht 69.0 in | Wt 195.0 lb

## 2018-08-24 DIAGNOSIS — Z1211 Encounter for screening for malignant neoplasm of colon: Secondary | ICD-10-CM

## 2018-08-24 DIAGNOSIS — R1012 Left upper quadrant pain: Secondary | ICD-10-CM | POA: Diagnosis not present

## 2018-08-24 DIAGNOSIS — K279 Peptic ulcer, site unspecified, unspecified as acute or chronic, without hemorrhage or perforation: Secondary | ICD-10-CM

## 2018-08-24 LAB — CBC WITH DIFFERENTIAL/PLATELET
Basophils Absolute: 0 10*3/uL (ref 0.0–0.1)
Basophils Relative: 0.4 % (ref 0.0–3.0)
Eosinophils Absolute: 0.1 10*3/uL (ref 0.0–0.7)
Eosinophils Relative: 1.1 % (ref 0.0–5.0)
HCT: 50.4 % (ref 39.0–52.0)
Hemoglobin: 17.7 g/dL — ABNORMAL HIGH (ref 13.0–17.0)
Lymphocytes Relative: 39.8 % (ref 12.0–46.0)
Lymphs Abs: 3.1 10*3/uL (ref 0.7–4.0)
MCHC: 35.1 g/dL (ref 30.0–36.0)
MCV: 86 fl (ref 78.0–100.0)
Monocytes Absolute: 0.5 10*3/uL (ref 0.1–1.0)
Monocytes Relative: 6.6 % (ref 3.0–12.0)
Neutro Abs: 4.1 10*3/uL (ref 1.4–7.7)
Neutrophils Relative %: 52.1 % (ref 43.0–77.0)
Platelets: 220 10*3/uL (ref 150.0–400.0)
RBC: 5.86 Mil/uL — ABNORMAL HIGH (ref 4.22–5.81)
RDW: 12.7 % (ref 11.5–15.5)
WBC: 7.8 10*3/uL (ref 4.0–10.5)

## 2018-08-24 MED ORDER — NA SULFATE-K SULFATE-MG SULF 17.5-3.13-1.6 GM/177ML PO SOLN: 1.0000 | Freq: Once | ORAL | 0 refills | Status: AC

## 2018-08-24 NOTE — Progress Notes (Signed)
Chief Complaint: Abdominal pain  Referring Provider:     Shelda Pal, DO   HPI:    Due to current restrictions/limitations of in-office visits due to the COVID-19 pandemic, this scheduled clinical appointment was converted to a telehealth virtual consultation using D[employer].  -The patient did consent to this virtual visit and is aware of possible charges through their insurance for this visit.  -Names of all parties present: Steve Nolan (patient), Gerrit Heck, DO, [facility] (physician) -Patient location: Home -Physician location: Office  Steve Nolan is a 58 y.o. male referred to the Gastroenterology Clinic for evaluation of LUQ abdominal pain.   Previously followed with Dr. Cindee Lame in [facility].  Had similar symptoms in 2010.  EGD at that time n/f multiple ulcers/PUD per patient, treated with Protonix, with resolution of ulcers on repeat EGD a few months later. Bx negative for H pylori. Has had recurrence of those same sxs over the last few months. Describes LUQ pain. No radiation, no n/v/d/c, melena, hematochezia. Worse with EtOH (drinks occasionally on weekends) and spicy foods.  Otherwise, no weight loss, f/c, night sweats, hematochezia, melena.  Tolerating all p.o. intake.  He has treated with OTC Prilosec for a few weeks, with improvement, but not resolution. Has added baking soda QAM with some symptomatic improvement as well.  Unfortunately, he has been unable to get records from Dr. Cindee Lame (Dr. Cindee Lame has passed away and was told records were in [place], but they have been unable to locate for him thus far).   Colonoscopy completed a little over 9 years ago with Dr. Cindee Lame. No polyps and recalls recommendation to repeat in 10 years.  Requesting repeat this year.  FHx n/f mother with NHL, sister with NHL, lung CA, Father with melanoma. No GI malignancy, IBD, pancreatic or liver disease.   Most recent labs from 01/2018 with normal CMP.  Normal CBC in  2018.  No recent abdominal imaging for review.  Past medical history, past surgical history, social history, family history, medications, and allergies reviewed in the chart and with patient.    Past Medical History:  Diagnosis Date  . A-fib ([place])    20 years ago,went to cardiologist no treatment needed and no follow-up - dx. torn muscle.  . Chronic low back pain 11/11/2016  . Kidney stones   . Skin cancer      Past Surgical History:  Procedure Laterality Date  . COLONOSCOPY     around 2011. Dr Otho Darner. [facility]. Michela Pitcher he was due in 2021.   . ESOPHAGOGASTRODUODENOSCOPY     Around 2011  . EXTRACORPOREAL SHOCK WAVE LITHOTRIPSY Left 01/09/2017   Procedure: LEFT EXTRACORPOREAL SHOCK WAVE LITHOTRIPSY (ESWL);  Surgeon: Cleon Gustin, MD;  Location: [facility];  Service: Urology;  Laterality: Left;  . FUNCTIONAL ENDOSCOPIC SINUS SURGERY Bilateral 1998   deviated septum  . left shoulder surgery Left    arthoscopic   Family History  Problem Relation Age of Onset  . Hypertension Mother   . Diabetes Mother   . Non-Hodgkin's lymphoma Mother   . Heart disease Father   . Skin cancer Father   . Lung cancer Sister   . Non-Hodgkin's lymphoma Sister   . Stroke Maternal Grandmother   . Hypertension Maternal Grandmother   . Drug abuse Maternal Grandmother   . Heart disease Maternal Grandfather   . Hypertension Maternal Grandfather   . Heart disease Paternal Grandfather   .  Colon cancer Neg Hx   . Esophageal cancer Neg Hx   . Prostate cancer Neg Hx    Social History   Tobacco Use  . Smoking status: Never Smoker  . Smokeless tobacco: Never Used  Substance Use Topics  . Alcohol use: Yes    Comment: ocassionally   . Drug use: No   Current Outpatient Medications  Medication Sig Dispense Refill  . Cyanocobalamin (VITAMIN B 12 PO) Place 1 tablet under the tongue once a week.     . tadalafil (CIALIS) 5 MG tablet Take 1 tablet (5 mg total) by mouth daily as needed for erectile  dysfunction. 90 tablet 2   No current facility-administered medications for this visit.    Allergies  Allergen Reactions  . Sulfa Antibiotics Rash     Review of Systems: All systems reviewed and negative except where noted in HPI.     Physical Exam:    Physical exam not completed due to the nature of this telehealth communication.  Patient was otherwise alert and oriented and well communicative.   ASSESSMENT AND PLAN;   1) LUQ pain 2) History of PUD  - EGD to evaluate for recurrence of ulcers, with random and directed gastric biopsies to check for H. pylori -Check CBC - Resume PPI  3) CRC Screening: Last colonoscopy completed over 9 years ago, with recommendation repeat in 10 years.  Due for repeat within the year, and he is requesting to have this done at the same time as EGD.  Feel this is a reasonable request and will repeat colonoscopy at the time of EGD as he is due for this within the year.  -Schedule for colonoscopy at the time of EGD as above  The indications, risks, and benefits of EGD and colonoscopy were explained to the patient in detail. Risks include but are not limited to bleeding, perforation, adverse reaction to medications, and cardiopulmonary compromise. Sequelae include but are not limited to the possibility of surgery, hositalization, and mortality. The patient verbalized understanding and wished to proceed. All questions answered, referred to scheduler and bowel prep ordered. Further recommendations pending results of the exam.     Lavena Bullion, DO, FACG  08/24/2018, 9:07 AM   Nani Ravens, [place]*

## 2018-08-24 NOTE — Patient Instructions (Addendum)
If you are age 59 or older, your body mass index should be between 23-30. Your Body mass index is 28.8 kg/m. If this is out of the aforementioned range listed, please consider follow up with your Primary Care Provider.  If you are age 27 or younger, your body mass index should be between 19-25. Your Body mass index is 28.8 kg/m. If this is out of the aformentioned range listed, please consider follow up with your Primary Care Provider.   To help prevent the possible spread of infection to our patients, communities, and staff; we will be implementing the following measures:  As of now we are not allowing any visitors/family members to accompany you to any upcoming appointments with [facility]. If you have any concerns about this please contact our office to discuss prior to the appointment.   You have been scheduled for an endoscopy and colonoscopy. Please follow the written instructions given to you at your visit today. Please pick up your prep supplies at the pharmacy within the next 1-3 days. If you use inhalers (even only as needed), please bring them with you on the day of your procedure. Your physician has requested that you go to www.startemmi.com and enter the access code given to you at your visit today. This web site gives a general overview about your procedure. However, you should still follow specific instructions given to you by our office regarding your preparation for the procedure.  We have sent the following medications to your pharmacy for you to pick up at your convenience: [facility] provider has requested that you go to the basement level for lab work at [employer] office.[place] [address]. Press "B" on the elevator. The lab is located at the first door on the left as you exit the elevator.   It was a pleasure to see you today!  Vito Cirigliano, D.O.

## 2018-08-28 ENCOUNTER — Telehealth: Payer: Self-pay | Admitting: *Deleted

## 2018-08-28 NOTE — Telephone Encounter (Addendum)
Attempted to complete pre procedure screening. No answer. Name identifier. LMOM.    Covid-19 screening questions  Have you traveled in the last 14 days? No If yes where?  Do you now or have you had a fever in the last 14 days? No  Do you have any respiratory symptoms of shortness of breath or cough now or in the last 14 days? No  Do you have any family members or close contacts with diagnosed or suspected Covid-19 in the past 14 days? No  Have you been tested for Covid-19 and found to be positive? No

## 2018-08-31 ENCOUNTER — Encounter: Payer: Self-pay | Admitting: Gastroenterology

## 2018-08-31 ENCOUNTER — Other Ambulatory Visit: Payer: Self-pay

## 2018-08-31 ENCOUNTER — Ambulatory Visit (AMBULATORY_SURGERY_CENTER): Admitting: Gastroenterology

## 2018-08-31 VITALS — BP 135/76 | HR 69 | Temp 98.5°F | Resp 15

## 2018-08-31 DIAGNOSIS — K640 First degree hemorrhoids: Secondary | ICD-10-CM

## 2018-08-31 DIAGNOSIS — K2970 Gastritis, unspecified, without bleeding: Secondary | ICD-10-CM | POA: Diagnosis not present

## 2018-08-31 DIAGNOSIS — Z1211 Encounter for screening for malignant neoplasm of colon: Secondary | ICD-10-CM | POA: Diagnosis not present

## 2018-08-31 DIAGNOSIS — K5730 Diverticulosis of large intestine without perforation or abscess without bleeding: Secondary | ICD-10-CM

## 2018-08-31 DIAGNOSIS — R1012 Left upper quadrant pain: Secondary | ICD-10-CM

## 2018-08-31 DIAGNOSIS — K3189 Other diseases of stomach and duodenum: Secondary | ICD-10-CM | POA: Diagnosis not present

## 2018-08-31 DIAGNOSIS — K635 Polyp of colon: Secondary | ICD-10-CM | POA: Diagnosis not present

## 2018-08-31 DIAGNOSIS — D124 Benign neoplasm of descending colon: Secondary | ICD-10-CM

## 2018-08-31 MED ORDER — OMEPRAZOLE 20 MG PO CPDR: 20.0000 mg | DELAYED_RELEASE_CAPSULE | Freq: Two times a day (BID) | ORAL | 0 refills | Status: DC

## 2018-08-31 MED ORDER — SODIUM CHLORIDE 0.9 % IV SOLN: 500.0000 mL | Freq: Once | INTRAVENOUS | Status: DC

## 2018-08-31 NOTE — Patient Instructions (Addendum)
YOU HAD AN ENDOSCOPIC PROCEDURE TODAY AT [facility]:   Refer to the procedure report that was given to you for any specific questions about what was found during the examination.  If the procedure report does not answer your questions, please call your gastroenterologist to clarify.  If you requested that your care partner not be given the details of your procedure findings, then the procedure report has been included in a sealed envelope for you to review at your convenience later.  YOU SHOULD EXPECT: Some feelings of bloating in the abdomen. Passage of more gas than usual.  Walking can help get rid of the air that was put into your GI tract during the procedure and reduce the bloating. If you had a lower endoscopy (such as a colonoscopy or flexible sigmoidoscopy) you may notice spotting of blood in your stool or on the toilet paper. If you underwent a bowel prep for your procedure, you may not have a normal bowel movement for a few days.  Please Note:  You might notice some irritation and congestion in your nose or some drainage.  This is from the oxygen used during your procedure.  There is no need for concern and it should clear up in a day or so.  SYMPTOMS TO REPORT IMMEDIATELY:   Following lower endoscopy (colonoscopy or flexible sigmoidoscopy):  Excessive amounts of blood in the stool  Significant tenderness or worsening of abdominal pains  Swelling of the abdomen that is new, acute  Fever of 100F or higher   Following upper endoscopy (EGD)  Vomiting of blood or coffee ground material  New chest pain or pain under the shoulder blades  Painful or persistently difficult swallowing  New shortness of breath  Fever of 100F or higher  Black, tarry-looking stools  For urgent or emergent issues, a gastroenterologist can be reached at any hour by calling [phone].   DIET:  We do recommend a small meal at first, but then you may proceed to your regular diet.  Drink  plenty of fluids but you should avoid alcoholic beverages for 24 hours.  ACTIVITY:  You should plan to take it easy for the rest of today and you should NOT DRIVE or use heavy machinery until tomorrow (because of the sedation medicines used during the test).    FOLLOW UP: Our staff will call the number listed on your records 48-72 hours following your procedure to check on you and address any questions or concerns that you may have regarding the information given to you following your procedure. If we do not reach you, we will leave a message.  We will attempt to reach you two times.  During this call, we will ask if you have developed any symptoms of COVID 19. If you develop any symptoms (ie: fever, flu-like symptoms, shortness of breath, cough etc.) before then, please call [phone].  If you test positive for Covid 19 in the 2 weeks post procedure, please call and report this information to [place].    If any biopsies were taken you will be contacted by phone or by letter within the next 1-3 weeks.  Please call us at [phone] if you have not heard about the biopsies in 3 weeks.    Gastritis (handout given) Polyps (handout given) Hemorrhoids (handout given) Prilosec (omeprazole) 20 mg take twice a day for 4 weeks Office will call you with a follow up appointment  SIGNATURES/CONFIDENTIALITY: You and/or your care partner have signed paperwork which  will be entered into your electronic medical record.  These signatures attest to the fact that that the information above on your After Visit Summary has been reviewed and is understood.  Full responsibility of the confidentiality of this discharge information lies with you and/or your care-partner.

## 2018-08-31 NOTE — Op Note (Signed)
[place] Patient Name: Steve Nolan Procedure Date: 08/31/2018 12:41 PM MRN: [id number] Endoscopist: Gerrit Heck , MD Age: 58 Referring MD:  Date of Birth: 04-28-1960 Gender: Male Account #: [id number] Procedure:                Colonoscopy Indications:              Screening for colorectal malignant neoplasm (last                            colonoscopy was 10 years ago) Medicines:                Monitored Anesthesia Care Procedure:                Pre-Anesthesia Assessment:                           - Prior to the procedure, a History and Physical                            was performed, and patient medications and                            allergies were reviewed. The patient's tolerance of                            previous anesthesia was also reviewed. The risks                            and benefits of the procedure and the sedation                            options and risks were discussed with the patient.                            All questions were answered, and informed consent                            was obtained. Prior Anticoagulants: The patient has                            taken no previous anticoagulant or antiplatelet                            agents. ASA Grade Assessment: II - A patient with                            mild systemic disease. After reviewing the risks                            and benefits, the patient was deemed in                            satisfactory condition to undergo the procedure.  After obtaining informed consent, the colonoscope                            was passed under direct vision. Throughout the                            procedure, the patient's blood pressure, pulse, and                            oxygen saturations were monitored continuously. The                            Model CF-HQ190L [id number]) scope was introduced                            through the anus and advanced  to the the terminal                            ileum. The colonoscopy was performed without                            difficulty. The patient tolerated the procedure                            well. The quality of the bowel preparation was                            adequate. The terminal ileum, ileocecal valve,                            appendiceal orifice, and rectum were photographed. Scope In: 1:15:42 PM Scope Out: 1:28:45 PM Scope Withdrawal Time: 0 hours 8 minutes 6 seconds  Total Procedure Duration: 0 hours 13 minutes 3 seconds  Findings:                 The perianal and digital rectal examinations were                            normal.                           A 2 mm polyp was found in the descending colon. The                            polyp was sessile. The polyp was removed with a                            cold biopsy forceps. Resection and retrieval were                            complete. Estimated blood loss was minimal.                           A few small-mouthed diverticula were found in the  sigmoid colon.                           Non-bleeding internal hemorrhoids were found during                            retroflexion. The hemorrhoids were small.                           A single medium-sized angioectasia without bleeding                            was found in the proximal ascending colon.                           The exam was otherwise normal throughout the                            examined colon.                           Retroflexion in the right colon was performed.                           The terminal ileum appeared normal. Complications:            No immediate complications. Estimated Blood Loss:     Estimated blood loss was minimal. Impression:               - One 2 mm polyp in the descending colon, removed                            with a cold biopsy forceps. Resected and retrieved.                           -  Diverticulosis in the sigmoid colon.                           - Non-bleeding internal hemorrhoids.                           - A single non-bleeding colonic angioectasia.                           - The examined portion of the ileum was normal. Recommendation:           - Patient has a contact number available for                            emergencies. The signs and symptoms of potential                            delayed complications were discussed with the                            patient. Return to normal activities tomorrow.  Written discharge instructions were provided to the                            patient.                           - Resume previous diet.                           - Continue present medications.                           - Await pathology results.                           - Repeat colonoscopy in 7-10 years for surveillance                            based on pathology results.                           - Return to GI office PRN. Gerrit Heck, MD 08/31/2018 1:39:03 PM

## 2018-08-31 NOTE — Op Note (Signed)
[place] Patient Name: Steve Nolan Procedure Date: 08/31/2018 12:42 PM MRN: [id number] Endoscopist: Gerrit Heck , MD Age: 58 Referring MD:  Date of Birth: Jul 28, 1960 Gender: Male Account #: [id number] Procedure:                Upper GI endoscopy Indications:              Abdominal pain in the left upper quadrant,                            Exclusion of peptic ulcer, History of Peptic Ulcer                            Disease in 2010. Medicines:                Monitored Anesthesia Care Procedure:                Pre-Anesthesia Assessment:                           - Prior to the procedure, a History and Physical                            was performed, and patient medications and                            allergies were reviewed. The patient's tolerance of                            previous anesthesia was also reviewed. The risks                            and benefits of the procedure and the sedation                            options and risks were discussed with the patient.                            All questions were answered, and informed consent                            was obtained. Prior Anticoagulants: The patient has                            taken no previous anticoagulant or antiplatelet                            agents. ASA Grade Assessment: II - A patient with                            mild systemic disease. After reviewing the risks                            and benefits, the patient was deemed in  satisfactory condition to undergo the procedure.                           After obtaining informed consent, the endoscope was                            passed under direct vision. Throughout the                            procedure, the patient's blood pressure, pulse, and                            oxygen saturations were monitored continuously. The                            Model GIF-HQ190 [id number]) scope was  introduced                            through the mouth, and advanced to the second part                            of duodenum. The upper GI endoscopy was                            accomplished without difficulty. The patient                            tolerated the procedure well. Scope In: Scope Out: Findings:                 The examined esophagus was normal.                           The Z-line was regular and was found 38 cm from the                            incisors.                           A single, 4 mm non-bleeding erosion was found in                            the gastric antrum. There were no stigmata of                            recent bleeding. Biopsies were taken with a cold                            forceps for histology. Estimated blood loss was                            minimal.                           Scattered mild inflammation characterized by  erythema was found in the gastric body and in the                            gastric antrum. Biopsies were taken with a cold                            forceps for Helicobacter pylori testing. Estimated                            blood loss was minimal.                           The duodenal bulb, first portion of the duodenum                            and second portion of the duodenum were normal. Complications:            No immediate complications. Estimated Blood Loss:     Estimated blood loss was minimal. Impression:               - Normal esophagus.                           - Z-line regular, 38 cm from the incisors.                           - Non-bleeding erosive gastropathy. Biopsied.                           - Gastritis. Biopsied.                           - Normal duodenal bulb, first portion of the                            duodenum and second portion of the duodenum. Recommendation:           - Patient has a contact number available for                             emergencies. The signs and symptoms of potential                            delayed complications were discussed with the                            patient. Return to normal activities tomorrow.                            Written discharge instructions were provided to the                            patient.                           - Resume previous diet.                           -  Continue present medications.                           - Await pathology results.                           - Return to GI clinic at appointment to be                            scheduled.                           - Use Prilosec (omeprazole) 20 mg PO BID for 4                            weeks. Gerrit Heck, MD 08/31/2018 1:34:53 PM

## 2018-08-31 NOTE — Progress Notes (Signed)
Report to PACU, RN, vss, BBS= Clear.  

## 2018-08-31 NOTE — Progress Notes (Signed)
Vs by Domingo Mend & covid screening questions and temp by [facility] @ front desk

## 2018-09-02 ENCOUNTER — Telehealth: Payer: Self-pay | Admitting: *Deleted

## 2018-09-02 ENCOUNTER — Telehealth: Payer: Self-pay

## 2018-09-02 NOTE — Telephone Encounter (Signed)
  Follow up Call-  Call back number 08/31/2018  Post procedure Call Back phone  # [phone]  Permission to leave phone message Yes  Some recent data might be hidden     Patient questions:  Do you have a fever, pain , or abdominal swelling? No. Pain Score  0 *  Have you tolerated food without any problems? Yes.    Have you been able to return to your normal activities? Yes.    Do you have any questions about your discharge instructions: Diet   No. Medications  No. Follow up visit  No.  Do you have questions or concerns about your Care? No.  Actions: * If pain score is 4 or above: No action needed, pain <4.  1. Have you developed a fever since your procedure? NO  2.   Have you had an respiratory symptoms (SOB or cough) since your procedure? NO  3.   Have you tested positive for COVID 19 since your procedure NO  4.   Have you had any family members/close contacts diagnosed with the COVID 19 since your procedure?  NO   If yes to any of these questions please route to Joylene John, RN and Alphonsa Gin, RN.

## 2018-09-02 NOTE — Telephone Encounter (Signed)
  Follow up Call-  Call back number 08/31/2018  Post procedure Call Back phone  # [phone]  Permission to leave phone message Yes  Some recent data might be hidden     Left message on voicemail Will try again midday

## 2018-09-04 ENCOUNTER — Encounter: Payer: Self-pay | Admitting: Gastroenterology

## 2018-09-15 ENCOUNTER — Encounter: Payer: Self-pay | Admitting: Gastroenterology

## 2018-09-15 ENCOUNTER — Other Ambulatory Visit: Payer: Self-pay

## 2018-09-15 ENCOUNTER — Telehealth (INDEPENDENT_AMBULATORY_CARE_PROVIDER_SITE_OTHER): Admitting: Gastroenterology

## 2018-09-15 VITALS — Ht 69.0 in | Wt 188.0 lb

## 2018-09-15 DIAGNOSIS — K279 Peptic ulcer, site unspecified, unspecified as acute or chronic, without hemorrhage or perforation: Secondary | ICD-10-CM | POA: Diagnosis not present

## 2018-09-15 DIAGNOSIS — K2970 Gastritis, unspecified, without bleeding: Secondary | ICD-10-CM | POA: Diagnosis not present

## 2018-09-15 DIAGNOSIS — R1012 Left upper quadrant pain: Secondary | ICD-10-CM | POA: Diagnosis not present

## 2018-09-15 DIAGNOSIS — K5790 Diverticulosis of intestine, part unspecified, without perforation or abscess without bleeding: Secondary | ICD-10-CM

## 2018-09-15 MED ORDER — OMEPRAZOLE 20 MG PO CPDR: 20.0000 mg | DELAYED_RELEASE_CAPSULE | Freq: Every day | ORAL | 0 refills | Status: DC

## 2018-09-15 NOTE — Patient Instructions (Signed)
If you are age 60 or older, your body mass index should be between 23-30. Your Body mass index is 27.76 kg/m. If this is out of the aforementioned range listed, please consider follow up with your Primary Care Provider.  If you are age 68 or younger, your body mass index should be between 19-25. Your Body mass index is 27.76 kg/m. If this is out of the aformentioned range listed, please consider follow up with your Primary Care Provider.   To help prevent the possible spread of infection to our patients, communities, and staff; we will be implementing the following measures:  As of now we are not allowing any visitors/family members to accompany you to any upcoming appointments with [facility]. If you have any concerns about this please contact our office to discuss prior to the appointment.   We have sent the following medications to your pharmacy for you to pick up at your convenience: Prilosec 20mg  by mouth daily, begin taking after you complete your current 30 day dosing of twice daily.  It was a pleasure to see you today!  Vito Cirigliano, D.O.

## 2018-09-15 NOTE — Progress Notes (Signed)
Chief Complaint: LUQ pain, PUD  Referring Provider:     Shelda Pal, DO  GI history: 58 year old male initially seen by me in 08/2018 with LUQ pain.  Similar symptoms in 2010 with EGD at that time n/f multiple ulcers/PUD per patient, treated with Protonix, with resolution of ulcers on repeat EGD a few months later. Bx negative for H pylori. Has had recurrence of those same sxs over the last few months. Describes LUQ pain. No radiation, no n/v/d/c, melena, hematochezia. Worse with EtOH (drinks occasionally on weekends) and spicy foods.  Otherwise, no weight loss, f/c, night sweats, hematochezia, melena.  Tolerating all p.o. intake.  Repeat EGD on 08/31/2018 with 4 mm antral erosion and mild antral gastritis (biopsies gastritis without H. pylori), otherwise normal.  Treated with Prilosec 20 mg p.o. twice daily x4 weeks.  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  Repeat in 10 years.  FHx n/f mother with NHL, sister with NHL, lung CA, Father with melanoma. No GI malignancy, IBD, pancreatic or liver disease.    HPI:    Due to current restrictions/limitations of in-office visits due to the COVID-19 pandemic, this scheduled clinical appointment was converted to a telehealth virtual consultation using [employer].  -Time of medical discussion: 15 minutes -The patient did consent to this virtual visit and is aware of possible charges through their insurance for this visit.  -Names of all parties present: Steve Nolan (patient), Gerrit Heck, DO, [facility] (physician) -Patient location: Home -Physician location: Office  Steve Nolan is a 58 y.o. male presenting to the Gastroenterology Clinic for routine follow-up.  Initially seen by me earlier this month for LUQ pain and ongoing CRC screening.  EGD with 4 mm antral erosion and mild antral  gastritis (biopsies benign).  Treated with Prilosec 20 mg p.o. twice daily x4 weeks.  Colonoscopy with 2 mm benign polyp, sigmoid diverticulosis, internal hemorrhoids, single AVM, with recommended repeat in 10 years.  Today states he is much improved since last appt/EGD.  Tolerating all p.o. intake.  No  N/v/d/c/f/c.  Has had a couple instances of mild LUQ discomfort, but much improved from previous.  Taking Prilosec 20 mg twice daily as prescribed.  Otherwise, he offers no complaints today and much appreciative for the care he has received.  Past medical history, past surgical history, social history, family history, medications, and allergies reviewed in the chart and with patient.    Past Medical History:  Diagnosis Date  . A-fib ([place])    20 years ago,went to cardiologist no treatment needed and no follow-up - dx. torn muscle.  . Chronic low back pain 11/11/2016  . Kidney stones   . Skin cancer      Past Surgical History:  Procedure Laterality Date  . COLONOSCOPY     around 2011. Dr Otho Darner. [facility]. Michela Pitcher he was due in 2021.   . ESOPHAGOGASTRODUODENOSCOPY     Around 2011  . EXTRACORPOREAL SHOCK WAVE LITHOTRIPSY Left 01/09/2017   Procedure: LEFT EXTRACORPOREAL SHOCK WAVE LITHOTRIPSY (ESWL);  Surgeon: Cleon Gustin, MD;  Location: [facility];  Service: Urology;  Laterality: Left;  . FUNCTIONAL ENDOSCOPIC SINUS SURGERY Bilateral 1998   deviated septum  . left shoulder surgery Left    arthoscopic   Family History  Problem Relation Age of Onset  .  Hypertension Mother   . Diabetes Mother   . Non-Hodgkin's lymphoma Mother   . Heart disease Father   . Skin cancer Father   . Lung cancer Sister   . Non-Hodgkin's lymphoma Sister   . Stroke Maternal Grandmother   . Hypertension Maternal Grandmother   . Drug abuse Maternal Grandmother   . Heart disease Maternal Grandfather   . Hypertension Maternal Grandfather   . Heart disease Paternal Grandfather   . Colon cancer Neg  Hx   . Esophageal cancer Neg Hx   . Prostate cancer Neg Hx    Social History   Tobacco Use  . Smoking status: Never Smoker  . Smokeless tobacco: Never Used  Substance Use Topics  . Alcohol use: Not Currently    Comment: ocassionally   . Drug use: No   Current Outpatient Medications  Medication Sig Dispense Refill  . Cyanocobalamin (VITAMIN B 12 PO) Place 1 tablet under the tongue once a week.     Marland Kitchen omeprazole (PRILOSEC) 20 MG capsule Take 1 capsule (20 mg total) by mouth 2 (two) times daily before a meal. 60 capsule 0  . tadalafil (CIALIS) 5 MG tablet Take 1 tablet (5 mg total) by mouth daily as needed for erectile dysfunction. 90 tablet 2   Current Facility-Administered Medications  Medication Dose Route Frequency Provider Last Rate Last Dose  . 0.9 %  sodium chloride infusion  500 mL Intravenous Once Izacc Demeyer V, DO       Allergies  Allergen Reactions  . Sulfa Antibiotics Rash     Review of Systems: All systems reviewed and negative except where noted in HPI.     Physical Exam:    Complete physical exam not completed due to the nature of this telehealth communication.   Gen: Awake, alert, and oriented, and well communicative. HEENT: EOMI, non-icteric sclera, NCAT, MMM Neck: Normal movement of head and neck Pulm: No labored breathing, speaking in full sentences without conversational dyspnea Derm: No apparent lesions or bruising in visible field MS: Moves all visible extremities without noticeable abnormality Psych: Pleasant, cooperative, normal speech, thought processing seemingly intact   ASSESSMENT AND PLAN;   1) PUD: 2) Gastritis: LUQ pain has essentially resolved.  History of PUD with small antral erosion and mild gastritis noted on recent EGD.  Biopsies otherwise benign.  -Resume Prilosec x4 weeks total as prescribed, then reduce to Prilosec 20 mg daily x1 week, then take as needed only -If symptoms recur in the future, recommended taking Prilosec  20 mg twice daily x2 to 4 weeks - If recurring symptoms, can consider serum gastrin test when off PPI therapy  3) LUQ pain: Resolved as above  4) Diverticulosis  5) CRC screening: -Repeat colonoscopy in 2030  RTC prn  I spent a total of 15 minutes of virtual face-to-face time with the patient. Greater than 50% of the time was spent counseling and coordinating care.    Lavena Bullion, DO, FACG  09/15/2018, 9:39 AM   Nani Ravens, [place]*

## 2018-11-04 ENCOUNTER — Encounter: Payer: Self-pay | Admitting: Family Medicine

## 2018-11-04 NOTE — Telephone Encounter (Signed)
Talked to pt. He states he went to a restaurant almost 2 weeks ago and the waitress he had tested positive. He wasn't around her long and she had a mask. He said he is not having any symptoms. He does not feel needing to test but will call if he has symptoms.

## 2018-11-27 ENCOUNTER — Encounter: Payer: Self-pay | Admitting: Family Medicine

## 2019-08-10 ENCOUNTER — Telehealth: Payer: Self-pay | Admitting: Family Medicine

## 2019-08-10 NOTE — Telephone Encounter (Signed)
Hey Dr. Nani Ravens. Patient is requesting to have Annual labs done before his appt on 06/02. So it can be discussed during the results. He states this was done for him back in 2019.  Is it possible to have orders place so we can get him an appointment. Please advise. Thanks

## 2019-08-10 NOTE — Telephone Encounter (Signed)
Plz order a CBC w diff, PSA, CMP, lipid panel, OK to come in prior, fasting plz. Double check to ensure he is still interested in PSA testing for prostate cancer screening. We normally briefly discuss at his appt and he agrees to continue checking yearly. Ty.

## 2019-08-11 ENCOUNTER — Other Ambulatory Visit: Payer: Self-pay

## 2019-08-11 DIAGNOSIS — Z125 Encounter for screening for malignant neoplasm of prostate: Secondary | ICD-10-CM

## 2019-08-11 DIAGNOSIS — Z0000 Encounter for general adult medical examination without abnormal findings: Secondary | ICD-10-CM

## 2019-08-11 NOTE — Telephone Encounter (Signed)
Patient returned call and would like to have PSA checked for screening. Future lab added, lab appt scheduled.

## 2019-08-11 NOTE — Telephone Encounter (Signed)
Lab orders entered as future except PSA. LM for pt to return call to see if still interested for screening

## 2019-08-19 ENCOUNTER — Other Ambulatory Visit (INDEPENDENT_AMBULATORY_CARE_PROVIDER_SITE_OTHER)

## 2019-08-19 ENCOUNTER — Other Ambulatory Visit: Payer: Self-pay

## 2019-08-19 DIAGNOSIS — Z0000 Encounter for general adult medical examination without abnormal findings: Secondary | ICD-10-CM | POA: Diagnosis not present

## 2019-08-19 DIAGNOSIS — Z125 Encounter for screening for malignant neoplasm of prostate: Secondary | ICD-10-CM

## 2019-08-19 LAB — CBC WITH DIFFERENTIAL/PLATELET
Basophils Absolute: 0 10*3/uL (ref 0.0–0.1)
Basophils Relative: 0.3 % (ref 0.0–3.0)
Eosinophils Absolute: 0.1 10*3/uL (ref 0.0–0.7)
Eosinophils Relative: 1.8 % (ref 0.0–5.0)
HCT: 49.1 % (ref 39.0–52.0)
Hemoglobin: 16.7 g/dL (ref 13.0–17.0)
Lymphocytes Relative: 45.3 % (ref 12.0–46.0)
Lymphs Abs: 3.5 10*3/uL (ref 0.7–4.0)
MCHC: 33.9 g/dL (ref 30.0–36.0)
MCV: 88.7 fl (ref 78.0–100.0)
Monocytes Absolute: 0.7 10*3/uL (ref 0.1–1.0)
Monocytes Relative: 8.7 % (ref 3.0–12.0)
Neutro Abs: 3.4 10*3/uL (ref 1.4–7.7)
Neutrophils Relative %: 43.9 % (ref 43.0–77.0)
Platelets: 208 10*3/uL (ref 150.0–400.0)
RBC: 5.54 Mil/uL (ref 4.22–5.81)
RDW: 13.2 % (ref 11.5–15.5)
WBC: 7.8 10*3/uL (ref 4.0–10.5)

## 2019-08-19 LAB — COMPREHENSIVE METABOLIC PANEL
ALT: 28 U/L (ref 0–53)
AST: 29 U/L (ref 0–37)
Albumin: 4.5 g/dL (ref 3.5–5.2)
Alkaline Phosphatase: 63 U/L (ref 39–117)
BUN: 19 mg/dL (ref 6–23)
CO2: 28 mEq/L (ref 19–32)
Calcium: 9.2 mg/dL (ref 8.4–10.5)
Chloride: 103 mEq/L (ref 96–112)
Creatinine, Ser: 0.99 mg/dL (ref 0.40–1.50)
GFR: 77.07 mL/min (ref >60.00)
Glucose, Bld: 81 mg/dL (ref 70–99)
Potassium: 3.8 mEq/L (ref 3.5–5.1)
Sodium: 137 mEq/L (ref 135–145)
Total Bilirubin: 0.9 mg/dL (ref 0.2–1.2)
Total Protein: 6.6 g/dL (ref 6.0–8.3)

## 2019-08-19 LAB — LIPID PANEL
Cholesterol: 206 mg/dL — ABNORMAL HIGH (ref 0–200)
HDL: 49.2 mg/dL (ref >39.00)
LDL Cholesterol: 146 mg/dL — ABNORMAL HIGH (ref 0–99)
NonHDL: 156.81
Total CHOL/HDL Ratio: 4
Triglycerides: 56 mg/dL (ref 0.0–149.0)
VLDL: 11.2 mg/dL (ref 0.0–40.0)

## 2019-08-19 LAB — PSA: PSA: 0.96 ng/mL (ref 0.10–4.00)

## 2019-08-25 ENCOUNTER — Other Ambulatory Visit: Payer: Self-pay

## 2019-08-25 ENCOUNTER — Encounter: Payer: Self-pay | Admitting: Family Medicine

## 2019-08-25 ENCOUNTER — Ambulatory Visit (INDEPENDENT_AMBULATORY_CARE_PROVIDER_SITE_OTHER): Admitting: Family Medicine

## 2019-08-25 VITALS — BP 110/76 | HR 74 | Temp 96.1°F | Ht 69.0 in | Wt 192.1 lb

## 2019-08-25 DIAGNOSIS — Z23 Encounter for immunization: Secondary | ICD-10-CM | POA: Diagnosis not present

## 2019-08-25 DIAGNOSIS — E785 Hyperlipidemia, unspecified: Secondary | ICD-10-CM | POA: Diagnosis not present

## 2019-08-25 DIAGNOSIS — Z114 Encounter for screening for human immunodeficiency virus [HIV]: Secondary | ICD-10-CM

## 2019-08-25 DIAGNOSIS — Z0000 Encounter for general adult medical examination without abnormal findings: Secondary | ICD-10-CM

## 2019-08-25 MED ORDER — TADALAFIL 5 MG PO TABS: 5.0000 mg | ORAL_TABLET | Freq: Every day | ORAL | 3 refills | Status: DC | PRN

## 2019-08-25 NOTE — Addendum Note (Signed)
Addended by: Sharon Seller B on: 08/25/2019 09:34 AM   Modules accepted: Orders

## 2019-08-25 NOTE — Progress Notes (Signed)
Chief Complaint  Patient presents with  . Annual Exam    Well Male Steve Nolan is here for a complete physical.   His last physical was >1 year ago.  Current diet: in general, a "healthy" diet.  Current exercise: cycling, running, wt resistance exercise Weight trend: stable Daytime fatigue? No. Seat belt? Yes.    Health maintenance Shingrix- No Colonoscopy- Yes Tetanus- Yes HIV- Yes Hep C- Yes   Past Medical History:  Diagnosis Date  . A-fib ([place])    20 years ago,went to cardiologist no treatment needed and no follow-up - dx. torn muscle.  . Chronic low back pain 11/11/2016  . Kidney stones   . Skin cancer       Past Surgical History:  Procedure Laterality Date  . COLONOSCOPY     around 2011. Dr Otho Darner. [facility]. Michela Pitcher he was due in 2021.   . ESOPHAGOGASTRODUODENOSCOPY     Around 2011  . EXTRACORPOREAL SHOCK WAVE LITHOTRIPSY Left 01/09/2017   Procedure: LEFT EXTRACORPOREAL SHOCK WAVE LITHOTRIPSY (ESWL);  Surgeon: Cleon Gustin, MD;  Location: [facility];  Service: Urology;  Laterality: Left;  . FUNCTIONAL ENDOSCOPIC SINUS SURGERY Bilateral 1998   deviated septum  . left shoulder surgery Left    arthoscopic    Medications  Current Outpatient Medications on File Prior to Visit  Medication Sig Dispense Refill  . Cyanocobalamin (VITAMIN B 12 PO) Place 1 tablet under the tongue once a week.     . tadalafil (CIALIS) 5 MG tablet Take 1 tablet (5 mg total) by mouth daily as needed for erectile dysfunction. 90 tablet 2   Allergies Allergies  Allergen Reactions  . Sulfa Antibiotics Rash   Family History Family History  Problem Relation Age of Onset  . Hypertension Mother   . Diabetes Mother   . Non-Hodgkin's lymphoma Mother   . Heart disease Father   . Skin cancer Father   . Lung cancer Sister   . Non-Hodgkin's lymphoma Sister   . Stroke Maternal Grandmother   . Hypertension Maternal Grandmother   . Drug abuse Maternal Grandmother   . Heart  disease Maternal Grandfather   . Hypertension Maternal Grandfather   . Heart disease Paternal Grandfather   . Colon cancer Neg Hx   . Esophageal cancer Neg Hx   . Prostate cancer Neg Hx     Review of Systems: Constitutional:  no fevers Eye:  no recent significant change in vision Ear/Nose/Mouth/Throat:  Ears:  no hearing loss Nose/Mouth/Throat:  no complaints of nasal congestion, no sore throat Cardiovascular:  no chest pain, no palpitations Respiratory:  no cough and no shortness of breath Gastrointestinal:  no abdominal pain, no change in bowel habits GU:  Male: negative for dysuria, frequency, and incontinence and negative for prostate symptoms Musculoskeletal/Extremities:  No new pain, redness, or swelling of the joints Integumentary (Skin/Breast):  no abnormal skin lesions reported Neurologic:  no headaches Endocrine: No unexpected weight changes Hematologic/Lymphatic:  no abnormal bleeding  Exam BP 110/76 (BP Location: Left Arm, Patient Position: Sitting, Cuff Size: Normal)   Pulse 74   Temp (!) 96.1 F (35.6 C) (Temporal)   Ht 5\' 9"  (1.753 m)   Wt 192 lb 2 oz (87.1 kg)   SpO2 97%   BMI 28.37 kg/m  General:  well developed, well nourished, in no apparent distress Skin:  no significant moles, warts, or growths Head:  no masses, lesions, or tenderness Eyes:  pupils equal and round, sclera anicteric without  injection Ears:  canals without lesions, TMs shiny without retraction, no obvious effusion, no erythema Nose:  nares patent, septum midline, mucosa normal Throat/Pharynx:  lips and gingiva without lesion; tongue and uvula midline; non-inflamed pharynx; no exudates or postnasal drainage Neck: neck supple without adenopathy, thyromegaly, or masses Cardiac: RRR, no bruits, no LE edema Lungs:  clear to auscultation, breath sounds equal bilaterally, no respiratory distress Rectal: Deferred Musculoskeletal:  symmetrical muscle groups noted without atrophy or  deformity Neuro:  gait normal; deep tendon reflexes normal and symmetric Psych: well oriented with normal range of affect and appropriate judgment/insight  Assessment and Plan  Well adult exam  Hyperlipidemia, unspecified hyperlipidemia type - Plan: Lipid panel  Screening for HIV (human immunodeficiency virus) - Plan: HIV Antibody (routine testing w rflx)   Well 59 y.o. male. Counseled on diet and exercise. Counseled on risks and benefits of prostate cancer screening with PSA. The patient agreed to undergo testing. Immunizations, labs, and further orders as above. Follow up in 1 yr or prn. 2nd Shingrix in 2-6 mo. Reck labs in 1 mo.  The patient voiced understanding and agreement to the plan.  [place], DO 08/25/19 9:22 AM

## 2019-08-25 NOTE — Patient Instructions (Addendum)
Keep the diet clean and stay active.  Let us know if you need anything. 

## 2019-09-23 ENCOUNTER — Other Ambulatory Visit (INDEPENDENT_AMBULATORY_CARE_PROVIDER_SITE_OTHER)

## 2019-09-23 ENCOUNTER — Other Ambulatory Visit: Payer: Self-pay

## 2019-09-23 DIAGNOSIS — E785 Hyperlipidemia, unspecified: Secondary | ICD-10-CM

## 2019-09-23 DIAGNOSIS — Z114 Encounter for screening for human immunodeficiency virus [HIV]: Secondary | ICD-10-CM

## 2019-09-23 LAB — LIPID PANEL
Cholesterol: 182 mg/dL (ref 0–200)
HDL: 43 mg/dL (ref >39.00)
LDL Cholesterol: 115 mg/dL — ABNORMAL HIGH (ref 0–99)
NonHDL: 139.27
Total CHOL/HDL Ratio: 4
Triglycerides: 120 mg/dL (ref 0.0–149.0)
VLDL: 24 mg/dL (ref 0.0–40.0)

## 2019-09-24 LAB — HIV ANTIBODY (ROUTINE TESTING W REFLEX): HIV 1&2 Ab, 4th Generation: NONREACTIVE

## 2019-09-29 ENCOUNTER — Other Ambulatory Visit

## 2019-12-01 ENCOUNTER — Ambulatory Visit (INDEPENDENT_AMBULATORY_CARE_PROVIDER_SITE_OTHER)

## 2019-12-01 ENCOUNTER — Other Ambulatory Visit: Payer: Self-pay

## 2019-12-01 DIAGNOSIS — Z23 Encounter for immunization: Secondary | ICD-10-CM

## 2019-12-24 ENCOUNTER — Ambulatory Visit (INDEPENDENT_AMBULATORY_CARE_PROVIDER_SITE_OTHER): Admitting: Family Medicine

## 2019-12-24 ENCOUNTER — Other Ambulatory Visit: Payer: Self-pay

## 2019-12-24 ENCOUNTER — Ambulatory Visit (HOSPITAL_BASED_OUTPATIENT_CLINIC_OR_DEPARTMENT_OTHER)
Admission: RE | Admit: 2019-12-24 | Discharge: 2019-12-24 | Disposition: A | Discharge status: Home / Self Care | Source: Ambulatory Visit | Attending: Family Medicine | Admitting: Family Medicine

## 2019-12-24 ENCOUNTER — Encounter: Payer: Self-pay | Admitting: Family Medicine

## 2019-12-24 VITALS — BP 108/62 | HR 73 | Temp 97.7°F | Ht 69.0 in | Wt 188.5 lb

## 2019-12-24 DIAGNOSIS — Z23 Encounter for immunization: Secondary | ICD-10-CM

## 2019-12-24 DIAGNOSIS — M542 Cervicalgia: Secondary | ICD-10-CM

## 2019-12-24 NOTE — Patient Instructions (Signed)
Heat (pad or rice pillow in microwave) over affected area, 10-15 minutes twice daily.   OK to take Tylenol 1000 mg (2 extra strength tabs) or 975 mg (3 regular strength tabs) every 6 hours as needed.  EXERCISES RANGE OF MOTION (ROM) AND STRETCHING EXERCISES  These exercises may help you when beginning to rehabilitate your issue. In order to successfully resolve your symptoms, you must improve your posture. These exercises are designed to help reduce the forward-head and rounded-shoulder posture which contributes to this condition. Your symptoms may resolve with or without further involvement from your physician, physical therapist or athletic trainer. While completing these exercises, remember:   Restoring tissue flexibility helps normal motion to return to the joints. This allows healthier, less painful movement and activity.  An effective stretch should be held for at least 20 seconds, although you may need to begin with shorter hold times for comfort.  A stretch should never be painful. You should only feel a gentle lengthening or release in the stretched tissue.  Do not do any stretch or exercise that you cannot tolerate.  STRETCH- Axial Extensors  Lie on your back on the floor. You may bend your knees for comfort. Place a rolled-up hand towel or dish towel, about 2 inches in diameter, under the part of your head that makes contact with the floor.  Gently tuck your chin, as if trying to make a "double chin," until you feel a gentle stretch at the base of your head.  Hold 15-20 seconds. Repeat 2-3 times. Complete this exercise 1 time per day.   STRETCH - Axial Extension   Stand or sit on a firm surface. Assume a good posture: chest up, shoulders drawn back, abdominal muscles slightly tense, knees unlocked (if standing) and feet hip width apart.  Slowly retract your chin so your head slides back and your chin slightly lowers. Continue to look straight ahead.  You should feel a gentle  stretch in the back of your head. Be certain not to feel an aggressive stretch since this can cause headaches later.  Hold for 15-20 seconds. Repeat 2-3 times. Complete this exercise 1 time per day.  STRETCH - Cervical Side Bend   Stand or sit on a firm surface. Assume a good posture: chest up, shoulders drawn back, abdominal muscles slightly tense, knees unlocked (if standing) and feet hip width apart.  Without letting your nose or shoulders move, slowly tip your right / left ear to your shoulder until your feel a gentle stretch in the muscles on the opposite side of your neck.  Hold 15-20 seconds. Repeat 2-3 times. Complete this exercise 1-2 times per day.  STRETCH - Cervical Rotators   Stand or sit on a firm surface. Assume a good posture: chest up, shoulders drawn back, abdominal muscles slightly tense, knees unlocked (if standing) and feet hip width apart.  Keeping your eyes level with the ground, slowly turn your head until you feel a gentle stretch along the back and opposite side of your neck.  Hold 15-20 seconds. Repeat 2-3 times. Complete this exercise 1-2 times per day.  RANGE OF MOTION - Neck Circles   Stand or sit on a firm surface. Assume a good posture: chest up, shoulders drawn back, abdominal muscles slightly tense, knees unlocked (if standing) and feet hip width apart.  Gently roll your head down and around from the back of one shoulder to the back of the other. The motion should never be forced or painful.  Repeat  the motion 10-20 times, or until you feel the neck muscles relax and loosen. Repeat 2-3 times. Complete the exercise 1-2 times per day. STRENGTHENING EXERCISES - Cervical Strain and Sprain These exercises may help you when beginning to rehabilitate your injury. They may resolve your symptoms with or without further involvement from your physician, physical therapist, or athletic trainer. While completing these exercises, remember:   Muscles can gain both  the endurance and the strength needed for everyday activities through controlled exercises.  Complete these exercises as instructed by your physician, physical therapist, or athletic trainer. Progress the resistance and repetitions only as guided.  You may experience muscle soreness or fatigue, but the pain or discomfort you are trying to eliminate should never worsen during these exercises. If this pain does worsen, stop and make certain you are following the directions exactly. If the pain is still present after adjustments, discontinue the exercise until you can discuss the trouble with your clinician.  STRENGTH - Cervical Flexors, Isometric  Face a wall, standing about 6 inches away. Place a small pillow, a ball about 6-8 inches in diameter, or a folded towel between your forehead and the wall.  Slightly tuck your chin and gently push your forehead into the soft object. Push only with mild to moderate intensity, building up tension gradually. Keep your jaw and forehead relaxed.  Hold 10 to 20 seconds. Keep your breathing relaxed.  Release the tension slowly. Relax your neck muscles completely before you start the next repetition. Repeat 2-3 times. Complete this exercise 1 time per day.  STRENGTH- Cervical Lateral Flexors, Isometric   Stand about 6 inches away from a wall. Place a small pillow, a ball about 6-8 inches in diameter, or a folded towel between the side of your head and the wall.  Slightly tuck your chin and gently tilt your head into the soft object. Push only with mild to moderate intensity, building up tension gradually. Keep your jaw and forehead relaxed.  Hold 10 to 20 seconds. Keep your breathing relaxed.  Release the tension slowly. Relax your neck muscles completely before you start the next repetition. Repeat 2-3 times. Complete this exercise 1 time per day.  STRENGTH - Cervical Extensors, Isometric   Stand about 6 inches away from a wall. Place a small pillow, a  ball about 6-8 inches in diameter, or a folded towel between the back of your head and the wall.  Slightly tuck your chin and gently tilt your head back into the soft object. Push only with mild to moderate intensity, building up tension gradually. Keep your jaw and forehead relaxed.  Hold 10 to 20 seconds. Keep your breathing relaxed.  Release the tension slowly. Relax your neck muscles completely before you start the next repetition. Repeat 2-3 times. Complete this exercise 1 time per day.  POSTURE AND BODY MECHANICS CONSIDERATIONS Keeping correct posture when sitting, standing or completing your activities will reduce the stress put on different body tissues, allowing injured tissues a chance to heal and limiting painful experiences. The following are general guidelines for improved posture. Your physician or physical therapist will provide you with any instructions specific to your needs. While reading these guidelines, remember:  The exercises prescribed by your provider will help you have the flexibility and strength to maintain correct postures.  The correct posture provides the optimal environment for your joints to work. All of your joints have less wear and tear when properly supported by a spine with good posture. This means  you will experience a healthier, less painful body.  Correct posture must be practiced with all of your activities, especially prolonged sitting and standing. Correct posture is as important when doing repetitive low-stress activities (typing) as it is when doing a single heavy-load activity (lifting).  PROLONGED STANDING WHILE SLIGHTLY LEANING FORWARD When completing a task that requires you to lean forward while standing in one place for a long time, place either foot up on a stationary 2- to 4-inch high object to help maintain the best posture. When both feet are on the ground, the low back tends to lose its slight inward curve. If this curve flattens (or becomes  too large), then the back and your other joints will experience too much stress, fatigue more quickly, and can cause pain.   RESTING POSITIONS Consider which positions are most painful for you when choosing a resting position. If you have pain with flexion-based activities (sitting, bending, stooping, squatting), choose a position that allows you to rest in a less flexed posture. You would want to avoid curling into a fetal position on your side. If your pain worsens with extension-based activities (prolonged standing, working overhead), avoid resting in an extended position such as sleeping on your stomach. Most people will find more comfort when they rest with their spine in a more neutral position, neither too rounded nor too arched. Lying on a non-sagging bed on your side with a pillow between your knees, or on your back with a pillow under your knees will often provide some relief. Keep in mind, being in any one position for a prolonged period of time, no matter how correct your posture, can still lead to stiffness.  WALKING Walk with an upright posture. Your ears, shoulders, and hips should all line up. OFFICE WORK When working at a desk, create an environment that supports good, upright posture. Without extra support, muscles fatigue and lead to excessive strain on joints and other tissues.  CHAIR:  A chair should be able to slide under your desk when your back makes contact with the back of the chair. This allows you to work closely.  The chair's height should allow your eyes to be level with the upper part of your monitor and your hands to be slightly lower than your elbows.  Body position: ? Your feet should make contact with the floor. If this is not possible, use a foot rest. ? Keep your ears over your shoulders. This will reduce stress on your neck and low back.

## 2019-12-24 NOTE — Progress Notes (Signed)
Musculoskeletal Exam  Patient: Steve Nolan DOB: Aug 09, 1960  DOS: 12/24/2019  SUBJECTIVE:  Chief Complaint:   Chief Complaint  Patient presents with  . Neck Pain    Steve Nolan is a 59 y.o.  male for evaluation and treatment of neck pain.   Onset:  3 months ago. No inj or change in activity.  He did hit his head last year, but did not have issues after that and wanted to make sure it was not related to that.  Location: R side of neck Character:  sharp when he turns his head Progression of issue:  is unchanged Associated symptoms: no bruising, redness, or swelling; ROM WNL Treatment: to date has been home exercises.   Neurovascular symptoms: no  Past Medical History:  Diagnosis Date  . A-fib ([place])    20 years ago,went to cardiologist no treatment needed and no follow-up - dx. torn muscle.  . Chronic low back pain 11/11/2016  . Kidney stones   . Skin cancer     Objective: VITAL SIGNS: BP 108/62 (BP Location: Left Arm, Patient Position: Sitting, Cuff Size: Normal)   Pulse 73   Temp 97.7 F (36.5 C) (Oral)   Ht 5\' 9"  (1.753 m)   Wt 188 lb 8 oz (85.5 kg)   SpO2 97%   BMI 27.84 kg/m  Constitutional: Well formed, well developed. No acute distress. Thorax & Lungs: No accessory muscle use Musculoskeletal: Neck.   Normal active range of motion: yes.   Normal passive range of motion: yes Tenderness to palpation: no Deformity: no Ecchymosis: no Neurologic: Normal sensory function. No focal deficits noted. DTR's equal and symmetric in UE's. No clonus. 5/5 strength in UE's b/l Psychiatric: Normal mood. Age appropriate judgment and insight. Alert & oriented x 3.    Assessment:  Neck pain - Plan: DG Cervical Spine Complete  Need for influenza vaccination - Plan: Flu Vaccine QUAD 6+ mos PF IM (Fluarix Quad PF)  Plan: Stretches/exercises, heat, ice, Tylenol. Ck XR. Main reason was to ck XR and make sure he can see chiropractor.  F/u as originally scheduled or  prn. The patient voiced understanding and agreement to the plan.   [place], DO 12/24/19  12:59 PM

## 2020-08-10 DIAGNOSIS — Z114 Encounter for screening for human immunodeficiency virus [HIV]: Secondary | ICD-10-CM

## 2020-08-10 DIAGNOSIS — Z0000 Encounter for general adult medical examination without abnormal findings: Secondary | ICD-10-CM

## 2020-08-11 ENCOUNTER — Other Ambulatory Visit (INDEPENDENT_AMBULATORY_CARE_PROVIDER_SITE_OTHER)

## 2020-08-11 ENCOUNTER — Other Ambulatory Visit: Payer: Self-pay

## 2020-08-11 ENCOUNTER — Telehealth: Payer: Self-pay | Admitting: Family Medicine

## 2020-08-11 DIAGNOSIS — Z114 Encounter for screening for human immunodeficiency virus [HIV]: Secondary | ICD-10-CM

## 2020-08-11 DIAGNOSIS — Z0000 Encounter for general adult medical examination without abnormal findings: Secondary | ICD-10-CM

## 2020-08-11 LAB — COMPREHENSIVE METABOLIC PANEL
ALT: 31 U/L (ref 0–53)
AST: 27 U/L (ref 0–37)
Albumin: 4.5 g/dL (ref 3.5–5.2)
Alkaline Phosphatase: 63 U/L (ref 39–117)
BUN: 12 mg/dL (ref 6–23)
CO2: 29 mEq/L (ref 19–32)
Calcium: 9.4 mg/dL (ref 8.4–10.5)
Chloride: 102 mEq/L (ref 96–112)
Creatinine, Ser: 0.92 mg/dL (ref 0.40–1.50)
GFR: 90.02 mL/min (ref >60.00)
Glucose, Bld: 86 mg/dL (ref 70–99)
Potassium: 4.1 mEq/L (ref 3.5–5.1)
Sodium: 138 mEq/L (ref 135–145)
Total Bilirubin: 1 mg/dL (ref 0.2–1.2)
Total Protein: 6.7 g/dL (ref 6.0–8.3)

## 2020-08-11 LAB — URINALYSIS, ROUTINE W REFLEX MICROSCOPIC
Bilirubin Urine: NEGATIVE
Hgb urine dipstick: NEGATIVE
Leukocytes,Ua: NEGATIVE
Nitrite: NEGATIVE
Specific Gravity, Urine: 1.02 (ref 1.000–1.030)
Total Protein, Urine: NEGATIVE
Urine Glucose: NEGATIVE
Urobilinogen, UA: 0.2 (ref 0.0–1.0)
pH: 7 (ref 5.0–8.0)

## 2020-08-11 LAB — CBC WITH DIFFERENTIAL/PLATELET
Basophils Absolute: 0 10*3/uL (ref 0.0–0.1)
Basophils Relative: 0.5 % (ref 0.0–3.0)
Eosinophils Absolute: 0.2 10*3/uL (ref 0.0–0.7)
Eosinophils Relative: 2.9 % (ref 0.0–5.0)
HCT: 49.7 % (ref 39.0–52.0)
Hemoglobin: 17.1 g/dL — ABNORMAL HIGH (ref 13.0–17.0)
Lymphocytes Relative: 47.5 % — ABNORMAL HIGH (ref 12.0–46.0)
Lymphs Abs: 3.3 10*3/uL (ref 0.7–4.0)
MCHC: 34.4 g/dL (ref 30.0–36.0)
MCV: 85.5 fl (ref 78.0–100.0)
Monocytes Absolute: 0.6 10*3/uL (ref 0.1–1.0)
Monocytes Relative: 8.7 % (ref 3.0–12.0)
Neutro Abs: 2.8 10*3/uL (ref 1.4–7.7)
Neutrophils Relative %: 40.4 % — ABNORMAL LOW (ref 43.0–77.0)
Platelets: 228 10*3/uL (ref 150.0–400.0)
RBC: 5.81 Mil/uL (ref 4.22–5.81)
RDW: 12.9 % (ref 11.5–15.5)
WBC: 7 10*3/uL (ref 4.0–10.5)

## 2020-08-11 LAB — LIPID PANEL
Cholesterol: 185 mg/dL (ref 0–200)
HDL: 45.5 mg/dL (ref >39.00)
LDL Cholesterol: 127 mg/dL — ABNORMAL HIGH (ref 0–99)
NonHDL: 139.13
Total CHOL/HDL Ratio: 4
Triglycerides: 61 mg/dL (ref 0.0–149.0)
VLDL: 12.2 mg/dL (ref 0.0–40.0)

## 2020-08-11 LAB — PSA: PSA: 0.89 ng/mL (ref 0.10–4.00)

## 2020-08-11 NOTE — Telephone Encounter (Signed)
Pt dropped off copy of his Covid Immunization records to have on file. Document put at front office tray under providers name.

## 2020-08-14 LAB — HIV ANTIBODY (ROUTINE TESTING W REFLEX): HIV 1&2 Ab, 4th Generation: NONREACTIVE

## 2020-08-14 NOTE — Telephone Encounter (Signed)
Documentation will be done

## 2020-08-28 ENCOUNTER — Ambulatory Visit (INDEPENDENT_AMBULATORY_CARE_PROVIDER_SITE_OTHER): Admitting: Family Medicine

## 2020-08-28 ENCOUNTER — Other Ambulatory Visit: Payer: Self-pay

## 2020-08-28 ENCOUNTER — Encounter: Payer: Self-pay | Admitting: Family Medicine

## 2020-08-28 VITALS — BP 108/78 | HR 73 | Temp 97.9°F | Ht 69.0 in | Wt 192.1 lb

## 2020-08-28 DIAGNOSIS — Z0000 Encounter for general adult medical examination without abnormal findings: Secondary | ICD-10-CM | POA: Diagnosis not present

## 2020-08-28 DIAGNOSIS — D582 Other hemoglobinopathies: Secondary | ICD-10-CM

## 2020-08-28 DIAGNOSIS — E7800 Pure hypercholesterolemia, unspecified: Secondary | ICD-10-CM | POA: Diagnosis not present

## 2020-08-28 DIAGNOSIS — M25561 Pain in right knee: Secondary | ICD-10-CM | POA: Diagnosis not present

## 2020-08-28 MED ORDER — TADALAFIL 5 MG PO TABS: 5.0000 mg | ORAL_TABLET | Freq: Every day | ORAL | 3 refills | Status: DC

## 2020-08-28 NOTE — Patient Instructions (Addendum)
Give [place] 2-3 business days to get the results of your labs back.   Keep the diet clean and stay active.  Ice/cold pack over area for 10-15 min twice daily.  Heat (pad or rice pillow in microwave) over affected area, 10-15 minutes twice daily.   OK to take Tylenol 1000 mg (2 extra strength tabs) or 975 mg (3 regular strength tabs) every 6 hours as needed.  Use GoodRx.   Let us know if you need anything.  Knee Exercises It is normal to feel mild stretching, pulling, tightness, or discomfort as you do these exercises, but you should stop right away if you feel sudden pain or your pain gets worse. STRETCHING AND RANGE OF MOTION EXERCISES  These exercises warm up your muscles and joints and improve the movement and flexibility of your knee. These exercises also help to relieve pain, numbness, and tingling. Exercise A: Knee Extension, Prone  1. Lie on your abdomen on a bed. 2. Place your left / right knee just beyond the edge of the surface so your knee is not on the bed. You can put a towel under your left / right thigh just above your knee for comfort. 3. Relax your leg muscles and allow gravity to straighten your knee. You should feel a stretch behind your left / right knee. 4. Hold this position for 30 seconds. 5. Scoot up so your knee is supported between repetitions. Repeat 2 times. Complete this stretch 3 times per week. Exercise B: Knee Flexion, Active    1. Lie on your back with both knees straight. If this causes back discomfort, bend your left / right knee so your foot is flat on the floor. 2. Slowly slide your left / right heel back toward your buttocks until you feel a gentle stretch in the front of your knee or thigh. 3. Hold this position for 30 seconds. 4. Slowly slide your left / right heel back to the starting position. Repeat 2 times. Complete this exercise 3 times per week. Exercise C: Quadriceps, Prone    1. Lie on your abdomen on a firm surface, such as a bed or  padded floor. 2. Bend your left / right knee and hold your ankle. If you cannot reach your ankle or pant leg, loop a belt around your foot and grab the belt instead. 3. Gently pull your heel toward your buttocks. Your knee should not slide out to the side. You should feel a stretch in the front of your thigh and knee. 4. Hold this position for 30 seconds. Repeat 2 times. Complete this stretch 3 times per week. Exercise D: Hamstring, Supine  1. Lie on your back. 2. Loop a belt or towel over the ball of your left / right foot. The ball of your foot is on the walking surface, right under your toes. 3. Straighten your left / right knee and slowly pull on the belt to raise your leg until you feel a gentle stretch behind your knee. ? Do not let your left / right knee bend while you do this. ? Keep your other leg flat on the floor. 4. Hold this position for 30 seconds. Repeat 2 times. Complete this stretch 3 times per week. STRENGTHENING EXERCISES  These exercises build strength and endurance in your knee. Endurance is the ability to use your muscles for a long time, even after they get tired. Exercise E: Quadriceps, Isometric    1. Lie on your back with your left / right  leg extended and your other knee bent. Put a rolled towel or small pillow under your knee if told by your health care provider. 2. Slowly tense the muscles in the front of your left / right thigh. You should see your kneecap slide up toward your hip or see increased dimpling just above the knee. This motion will push the back of the knee toward the floor. 3. For 3 seconds, keep the muscle as tight as you can without increasing your pain. 4. Relax the muscles slowly and completely. Repeat for 10 total reps Repeat 2 ti mes. Complete this exercise 3 times per week. Exercise F: Straight Leg Raises - Quadriceps  1. Lie on your back with your left / right leg extended and your other knee bent. 2. Tense the muscles in the front of your  left / right thigh. You should see your kneecap slide up or see increased dimpling just above the knee. Your thigh may even shake a bit. 3. Keep these muscles tight as you raise your leg 4-6 inches (10-15 cm) off the floor. Do not let your knee bend. 4. Hold this position for 3 seconds. 5. Keep these muscles tense as you lower your leg. 6. Relax your muscles slowly and completely after each repetition. 10 total reps. Repeat 2 times. Complete this exercise 3 times per week.  Exercise G: Hamstring Curls    If told by your health care provider, do this exercise while wearing ankle weights. Begin with 5 lb weights (optional). Then increase the weight by 1 lb (0.5 kg) increments. Do not wear ankle weights that are more than 20 lbs to start with. 1. Lie on your abdomen with your legs straight. 2. Bend your left / right knee as far as you can without feeling pain. Keep your hips flat against the floor. 3. Hold this position for 3 seconds. 4. Slowly lower your leg to the starting position. Repeat for 10 reps.  Repeat 2 times. Complete this exercise 3 times per week. Exercise H: Squats (Quadriceps)  1. Stand in front of a table, with your feet and knees pointing straight ahead. You may rest your hands on the table for balance but not for support. 2. Slowly bend your knees and lower your hips like you are going to sit in a chair. ? Keep your weight over your heels, not over your toes. ? Keep your lower legs upright so they are parallel with the table legs. ? Do not let your hips go lower than your knees. ? Do not bend lower than told by your health care provider. ? If your knee pain increases, do not bend as low. 3. Hold the squat position for 1 second. 4. Slowly push with your legs to return to standing. Do not use your hands to pull yourself to standing. Repeat 2 times. Complete this exercise 3 times per week. Exercise I: Wall Slides (Quadriceps)    1. Lean your back against a smooth wall or  door while you walk your feet out 18-24 inches (46-61 cm) from it. 2. Place your feet hip-width apart. 3. Slowly slide down the wall or door until your knees Repeat 2 times. Complete this exercise every other day. 4. Exercise K: Straight Leg Raises - Hip Abductors  1. Lie on your side with your left / right leg in the top position. Lie so your head, shoulder, knee, and hip line up. You may bend your bottom knee to help you keep your balance. 2. Roll  your hips slightly forward so your hips are stacked directly over each other and your left / right knee is facing forward. 3. Leading with your heel, lift your top leg 4-6 inches (10-15 cm). You should feel the muscles in your outer hip lifting. ? Do not let your foot drift forward. ? Do not let your knee roll toward the ceiling. 4. Hold this position for 3 seconds. 5. Slowly return your leg to the starting position. 6. Let your muscles relax completely after each repetition. 10 total reps. Repeat 2 times. Complete this exercise 3 times per week. Exercise J: Straight Leg Raises - Hip Extensors  1. Lie on your abdomen on a firm surface. You can put a pillow under your hips if that is more comfortable. 2. Tense the muscles in your buttocks and lift your left / right leg about 4-6 inches (10-15 cm). Keep your knee straight as you lift your leg. 3. Hold this position for 3 seconds. 4. Slowly lower your leg to the starting position. 5. Let your leg relax completely after each repetition. Repeat 2 times. Complete this exercise 3 times per week. Document Released: 01/23/2005 Document Revised: 12/04/2015 Document Reviewed: 01/15/2015 [employer] Interactive Patient Education  2017 [employer].

## 2020-08-28 NOTE — Progress Notes (Signed)
Chief Complaint  Patient presents with  . Annual Exam    Well Male Steve Nolan is here for a complete physical.   His last physical was >1 year ago.  Current diet: in general, a "healthy" diet.  Current exercise: running, lifting, walking Weight trend: stable Fatigue out of ordinary? No. Seat belt? Yes.    Health maintenance Shingrix- Yes Colonoscopy- Yes Tetanus- Yes HIV- Yes Hep C- Yes  R knee pain Started after he took a 3 mi run 2 weeks ago. Felt fine during the run, woke up the next day with a swollen knee. Having pain as well. Decreased ROM 2/2 swelling. No bruising or redness. No neuro s/s's. Tried ice and heat without sig relief. Burst a bursa when he was in HS, never had any issues since. No catching/locking, unsteadiness.    Past Medical History:  Diagnosis Date  . A-fib ([place])    20 years ago,went to cardiologist no treatment needed and no follow-up - dx. torn muscle.  . Chronic low back pain 11/11/2016  . Kidney stones   . Skin cancer       Past Surgical History:  Procedure Laterality Date  . COLONOSCOPY     around 2011. Dr Otho Darner. [facility]. Michela Pitcher he was due in 2021.   . ESOPHAGOGASTRODUODENOSCOPY     Around 2011  . EXTRACORPOREAL SHOCK WAVE LITHOTRIPSY Left 01/09/2017   Procedure: LEFT EXTRACORPOREAL SHOCK WAVE LITHOTRIPSY (ESWL);  Surgeon: Cleon Gustin, MD;  Location: [facility];  Service: Urology;  Laterality: Left;  . FUNCTIONAL ENDOSCOPIC SINUS SURGERY Bilateral 1998   deviated septum  . left shoulder surgery Left    arthoscopic    Medications  Current Outpatient Medications on File Prior to Visit  Medication Sig Dispense Refill  . Cyanocobalamin (VITAMIN B 12 PO) Place 1 tablet under the tongue once a week.     . tadalafil (CIALIS) 5 MG tablet Take 1 tablet (5 mg total) by mouth daily as needed for erectile dysfunction. 90 tablet 3     Allergies Allergies  Allergen Reactions  . Sulfa Antibiotics Rash    Family  History Family History  Problem Relation Age of Onset  . Hypertension Mother   . Diabetes Mother   . Non-Hodgkin's lymphoma Mother   . Heart disease Father   . Skin cancer Father   . Lung cancer Sister   . Non-Hodgkin's lymphoma Sister   . Stroke Maternal Grandmother   . Hypertension Maternal Grandmother   . Drug abuse Maternal Grandmother   . Heart disease Maternal Grandfather   . Hypertension Maternal Grandfather   . Heart disease Paternal Grandfather   . Colon cancer Neg Hx   . Esophageal cancer Neg Hx   . Prostate cancer Neg Hx     Review of Systems: Constitutional:  no fevers Eye:  no recent significant change in vision Ear/Nose/Mouth/Throat:  Ears:  no hearing loss Nose/Mouth/Throat:  no complaints of nasal congestion, no sore throat Cardiovascular:  no chest pain Respiratory:  no shortness of breath Gastrointestinal:  no change in bowel habits GU:  Male: negative for dysuria, frequency Musculoskeletal/Extremities:  +R knee pain Integumentary (Skin/Breast):  no abnormal skin lesions reported Neurologic:  no headaches Endocrine: No unexpected weight changes Hematologic/Lymphatic:  no abnormal bleeding  Exam BP 108/78 (BP Location: Left Arm, Patient Position: Sitting, Cuff Size: Normal)   Pulse 73   Temp 97.9 F (36.6 C) (Oral)   Ht 5\' 9"  (1.753 m)   Wt 192  lb 2 oz (87.1 kg)   SpO2 99%   BMI 28.37 kg/m  General:  well developed, well nourished, in no apparent distress Skin:  no significant moles, warts, or growths Head:  no masses, lesions, or tenderness Eyes:  pupils equal and round, sclera anicteric without injection Ears:  canals without lesions, TMs shiny without retraction, no obvious effusion, no erythema Nose:  nares patent, septum midline, mucosa normal Throat/Pharynx:  lips and gingiva without lesion; tongue and uvula midline; non-inflamed pharynx; no exudates or postnasal drainage Neck: neck supple without adenopathy, thyromegaly, or masses Cardiac:  RRR, no bruits, no LE edema Lungs:  clear to auscultation, breath sounds equal bilaterally, no respiratory distress Abdomen: BS+, soft, non-tender, non-distended, no masses or organomegaly noted Rectal: Deferred Musculoskeletal: R knee: +effusion, no ttp, neg Lachman's, valgus/varus stress, patellar app/grind, McMurray's, Stine's; otherwise symmetrical muscle groups noted without atrophy or deformity Neuro:  gait normal; deep tendon reflexes normal and symmetric Psych: well oriented with normal range of affect and appropriate judgment/insight  Assessment and Plan  Well adult exam  Elevated hemoglobin (HCC) - Plan: CBC w/Diff  Pure hypercholesterolemia - Plan: Lipid panel  Acute pain of right knee   Well 60 y.o. male. Counseled on diet and exercise. R knee: Acute, sparing NSAID use, ice, Tylenol, stretches/exercises. Consider aspiration/steroid injection if no improvement.  Immunizations, labs, and further orders as above. Follow up in 2 weeks for labs, 1 year for CPE or prn. The patient voiced understanding and agreement to the plan.  [place], DO 08/28/20 9:44 AM

## 2020-08-28 NOTE — Addendum Note (Signed)
Addended by: Sharon Seller B on: 08/28/2020 09:46 AM   Modules accepted: Orders

## 2020-08-29 ENCOUNTER — Other Ambulatory Visit: Payer: Self-pay | Admitting: Family Medicine

## 2020-08-29 DIAGNOSIS — Z136 Encounter for screening for cardiovascular disorders: Secondary | ICD-10-CM

## 2020-08-30 ENCOUNTER — Telehealth: Payer: Self-pay | Admitting: *Deleted

## 2020-08-30 ENCOUNTER — Other Ambulatory Visit: Payer: Self-pay | Admitting: Family Medicine

## 2020-08-30 DIAGNOSIS — D582 Other hemoglobinopathies: Secondary | ICD-10-CM

## 2020-08-30 NOTE — Telephone Encounter (Signed)
Per referral Dr.  Nani Ravens - called and gave upcoming appointments - mailed welcome packet with calendar

## 2020-08-31 ENCOUNTER — Other Ambulatory Visit: Payer: Self-pay

## 2020-08-31 ENCOUNTER — Other Ambulatory Visit: Payer: Self-pay | Admitting: Family Medicine

## 2020-08-31 ENCOUNTER — Ambulatory Visit (HOSPITAL_BASED_OUTPATIENT_CLINIC_OR_DEPARTMENT_OTHER)
Admission: RE | Admit: 2020-08-31 | Discharge: 2020-08-31 | Disposition: A | Discharge status: Home / Self Care | Source: Ambulatory Visit | Attending: Family Medicine | Admitting: Family Medicine

## 2020-08-31 DIAGNOSIS — Z136 Encounter for screening for cardiovascular disorders: Secondary | ICD-10-CM | POA: Insufficient documentation

## 2020-08-31 MED ORDER — ROSUVASTATIN CALCIUM 20 MG PO TABS: 20.0000 mg | ORAL_TABLET | Freq: Every day | ORAL | 3 refills | Status: DC

## 2020-09-04 NOTE — Progress Notes (Signed)
Pt is aware and has been scheduled.

## 2020-09-11 ENCOUNTER — Other Ambulatory Visit (INDEPENDENT_AMBULATORY_CARE_PROVIDER_SITE_OTHER)

## 2020-09-11 ENCOUNTER — Other Ambulatory Visit: Payer: Self-pay

## 2020-09-11 DIAGNOSIS — E7800 Pure hypercholesterolemia, unspecified: Secondary | ICD-10-CM | POA: Diagnosis not present

## 2020-09-11 DIAGNOSIS — D582 Other hemoglobinopathies: Secondary | ICD-10-CM

## 2020-09-11 LAB — CBC WITH DIFFERENTIAL/PLATELET
Basophils Absolute: 0 10*3/uL (ref 0.0–0.1)
Basophils Relative: 0.3 % (ref 0.0–3.0)
Eosinophils Absolute: 0.2 10*3/uL (ref 0.0–0.7)
Eosinophils Relative: 2.1 % (ref 0.0–5.0)
HCT: 48.7 % (ref 39.0–52.0)
Hemoglobin: 16.7 g/dL (ref 13.0–17.0)
Lymphocytes Relative: 41.2 % (ref 12.0–46.0)
Lymphs Abs: 3 10*3/uL (ref 0.7–4.0)
MCHC: 34.3 g/dL (ref 30.0–36.0)
MCV: 86.9 fl (ref 78.0–100.0)
Monocytes Absolute: 0.6 10*3/uL (ref 0.1–1.0)
Monocytes Relative: 8.1 % (ref 3.0–12.0)
Neutro Abs: 3.6 10*3/uL (ref 1.4–7.7)
Neutrophils Relative %: 48.3 % (ref 43.0–77.0)
Platelets: 209 10*3/uL (ref 150.0–400.0)
RBC: 5.6 Mil/uL (ref 4.22–5.81)
RDW: 12.9 % (ref 11.5–15.5)
WBC: 7.3 10*3/uL (ref 4.0–10.5)

## 2020-09-11 LAB — LIPID PANEL
Cholesterol: 149 mg/dL (ref 0–200)
HDL: 48.3 mg/dL (ref >39.00)
LDL Cholesterol: 75 mg/dL (ref 0–99)
NonHDL: 100.44
Total CHOL/HDL Ratio: 3
Triglycerides: 126 mg/dL (ref 0.0–149.0)
VLDL: 25.2 mg/dL (ref 0.0–40.0)

## 2020-09-22 ENCOUNTER — Telehealth: Payer: Self-pay | Admitting: *Deleted

## 2020-09-22 NOTE — Telephone Encounter (Signed)
Called and lvm of rescheduling appointment from 09/27/20 to 09/28/20 - request callback to confirm.

## 2020-09-27 ENCOUNTER — Inpatient Hospital Stay: Admitting: Family

## 2020-09-27 ENCOUNTER — Telehealth: Payer: Self-pay | Admitting: *Deleted

## 2020-09-27 ENCOUNTER — Inpatient Hospital Stay

## 2020-09-27 NOTE — Telephone Encounter (Signed)
Called and lvm of appointment being rescheduled from 09/28/20 to 10/12/20 - request call back to confirm.

## 2020-09-28 ENCOUNTER — Other Ambulatory Visit

## 2020-09-28 ENCOUNTER — Ambulatory Visit: Admitting: Family

## 2020-10-11 ENCOUNTER — Other Ambulatory Visit: Payer: Self-pay | Admitting: Family

## 2020-10-11 DIAGNOSIS — D751 Secondary polycythemia: Secondary | ICD-10-CM

## 2020-10-12 ENCOUNTER — Inpatient Hospital Stay: Attending: Hematology & Oncology

## 2020-10-12 ENCOUNTER — Other Ambulatory Visit: Payer: Self-pay

## 2020-10-12 ENCOUNTER — Encounter: Payer: Self-pay | Admitting: Family

## 2020-10-12 ENCOUNTER — Inpatient Hospital Stay (HOSPITAL_BASED_OUTPATIENT_CLINIC_OR_DEPARTMENT_OTHER): Admitting: Family

## 2020-10-12 VITALS — BP 116/76 | HR 68 | Temp 97.8°F | Resp 18 | Ht 69.0 in | Wt 193.4 lb

## 2020-10-12 DIAGNOSIS — Z807 Family history of other malignant neoplasms of lymphoid, hematopoietic and related tissues: Secondary | ICD-10-CM | POA: Insufficient documentation

## 2020-10-12 DIAGNOSIS — Z85828 Personal history of other malignant neoplasm of skin: Secondary | ICD-10-CM | POA: Diagnosis not present

## 2020-10-12 DIAGNOSIS — D751 Secondary polycythemia: Secondary | ICD-10-CM

## 2020-10-12 DIAGNOSIS — Z803 Family history of malignant neoplasm of breast: Secondary | ICD-10-CM | POA: Insufficient documentation

## 2020-10-12 DIAGNOSIS — Z801 Family history of malignant neoplasm of trachea, bronchus and lung: Secondary | ICD-10-CM | POA: Diagnosis not present

## 2020-10-12 DIAGNOSIS — Z808 Family history of malignant neoplasm of other organs or systems: Secondary | ICD-10-CM | POA: Diagnosis not present

## 2020-10-12 LAB — CBC WITH DIFFERENTIAL (CANCER CENTER ONLY)
Abs Immature Granulocytes: 0.04 10*3/uL (ref 0.00–0.07)
Basophils Absolute: 0 10*3/uL (ref 0.0–0.1)
Basophils Relative: 1 %
Eosinophils Absolute: 0.2 10*3/uL (ref 0.0–0.5)
Eosinophils Relative: 3 %
HCT: 50.3 % (ref 39.0–52.0)
Hemoglobin: 17.4 g/dL — ABNORMAL HIGH (ref 13.0–17.0)
Immature Granulocytes: 1 %
Lymphocytes Relative: 43 %
Lymphs Abs: 2.9 10*3/uL (ref 0.7–4.0)
MCH: 29.6 pg (ref 26.0–34.0)
MCHC: 34.6 g/dL (ref 30.0–36.0)
MCV: 85.5 fL (ref 80.0–100.0)
Monocytes Absolute: 0.6 10*3/uL (ref 0.1–1.0)
Monocytes Relative: 9 %
Neutro Abs: 2.9 10*3/uL (ref 1.7–7.7)
Neutrophils Relative %: 43 %
Platelet Count: 209 10*3/uL (ref 150–400)
RBC: 5.88 MIL/uL — ABNORMAL HIGH (ref 4.22–5.81)
RDW: 12.1 % (ref 11.5–15.5)
WBC Count: 6.7 10*3/uL (ref 4.0–10.5)
nRBC: 0 % (ref 0.0–0.2)

## 2020-10-12 LAB — RETICULOCYTES
Immature Retic Fract: 6.9 % (ref 2.3–15.9)
RBC.: 5.93 MIL/uL — ABNORMAL HIGH (ref 4.22–5.81)
Retic Count, Absolute: 87.2 10*3/uL (ref 19.0–186.0)
Retic Ct Pct: 1.5 % (ref 0.4–3.1)

## 2020-10-12 LAB — CMP (CANCER CENTER ONLY)
ALT: 31 U/L (ref 0–44)
AST: 29 U/L (ref 15–41)
Albumin: 4.4 g/dL (ref 3.5–5.0)
Alkaline Phosphatase: 59 U/L (ref 38–126)
Anion gap: 8 (ref 5–15)
BUN: 11 mg/dL (ref 8–23)
CO2: 30 mmol/L (ref 22–32)
Calcium: 9.7 mg/dL (ref 8.9–10.3)
Chloride: 102 mmol/L (ref 98–111)
Creatinine: 0.96 mg/dL (ref 0.61–1.24)
GFR, Estimated: >60 mL/min (ref >60)
Glucose, Bld: 85 mg/dL (ref 70–99)
Potassium: 4.3 mmol/L (ref 3.5–5.1)
Sodium: 140 mmol/L (ref 135–145)
Total Bilirubin: 0.8 mg/dL (ref 0.3–1.2)
Total Protein: 7 g/dL (ref 6.5–8.1)

## 2020-10-12 LAB — LACTATE DEHYDROGENASE: LDH: 155 U/L (ref 98–192)

## 2020-10-12 LAB — SAVE SMEAR(SSMR), FOR PROVIDER SLIDE REVIEW

## 2020-10-12 NOTE — Progress Notes (Signed)
Hematology/Oncology Consultation   Name: Steve Nolan      MRN: [id number]    Location: Room/bed info not found  Date: 10/12/2020 Time:9:16 AM   REFERRING PHYSICIAN: Hart Carwin P. Wendling, DO  REASON FOR CONSULT: Elevated Hgb   DIAGNOSIS: Erythrocytosis  HISTORY OF PRESENT ILLNESS: [name] is a very pleasant 60 yo gentleman with intermittent mild erythrocytosis noted over the last 3 years.  Today's Hgb is 17.4, MCV 85, WBC count 6.7 and platelets 209.  He is symptomatic with fatigue and mild SOB with over exertion. He takes a break to rest/nap when needed.  He does not smoke or use recreational drugs.  He does not use testosterone or her supplementation.  He is taking a multivitamin and vitamin C supplement daily.  He has several beers and glasses of red wine per week.  He had his endoscopy and colonoscopy in June 2020 with Dr. Bryan Lemma. Endoscopy revealed a non bleeding ulcer and gastritis treated with 4 weeks of Prilosec. Colonoscopy revealed non-bleeding internal hemorrhoids, small mother diverticula of the sigmoid colon, a single medium sized non-bleeding angioectasia in the proximal ascending colon and a benign polyp removed from the descending colon.  He has personal history of skin cancer but does not remember which type.  His family history of cancer includes: mother - Non Hodgkin's lymphoma, father - skin cancer, sister - Non Hodgkin's lymphoma and lung cancer, maternal grandfather - lung and maternal grandmother - breast.  Past surgical history includes: left shoulder arthroscopy and nasal endoscopy.  No history of diabetes or thyroid disease.  No fever, chills, n/v, cough, rash, dizziness, chest pain, palpitations, abdominal pain/bloating or changes in bowel or bladder habits.  No hot flashes or night sweats.  No bleeding, bruising or petechiae.  No swelling, tenderness, numbness or tingling in his extremities.  No falls or syncope to report.  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   He is typically quite active out doors when not feeling fatigued.   ROS: All other 10 point review of systems is negative.   PAST MEDICAL HISTORY:   Past Medical History:  Diagnosis Date   A-fib ([place])    20 years ago,went to cardiologist no treatment needed and no follow-up - dx. torn muscle.   Chronic low back pain 11/11/2016   Kidney stones    Skin cancer     ALLERGIES: Allergies  Allergen Reactions   Sulfa Antibiotics Rash      MEDICATIONS:  Current Outpatient Medications on File Prior to Visit  Medication Sig Dispense Refill   Cyanocobalamin (VITAMIN B 12 PO) Place 1 tablet under the tongue once a week.      rosuvastatin (CRESTOR) 20 MG tablet Take 1 tablet (20 mg total) by mouth daily. 90 tablet 3   tadalafil (CIALIS) 5 MG tablet Take 1 tablet (5 mg total) by mouth daily. 90 tablet 3   No current facility-administered medications on file prior to visit.     PAST SURGICAL HISTORY Past Surgical History:  Procedure Laterality Date   COLONOSCOPY     around 2011. Dr Otho Darner. [facility]. Michela Pitcher he was due in 2021.    ESOPHAGOGASTRODUODENOSCOPY     Around 2011   EXTRACORPOREAL SHOCK WAVE LITHOTRIPSY Left 01/09/2017   Procedure: LEFT EXTRACORPOREAL SHOCK WAVE LITHOTRIPSY (ESWL);  Surgeon: Cleon Gustin, MD;  Location: [facility];  Service: Urology;  Laterality:  Left;   FUNCTIONAL ENDOSCOPIC SINUS SURGERY Bilateral 1998   deviated septum   left shoulder surgery Left    arthoscopic    FAMILY HISTORY: Family History  Problem Relation Age of Onset   Hypertension Mother    Diabetes Mother    Non-Hodgkin's lymphoma Mother    Heart disease Father    Skin cancer Father    Lung cancer Sister    Non-Hodgkin's lymphoma Sister    Stroke Maternal Grandmother    Hypertension Maternal Grandmother    Drug abuse Maternal Grandmother    Heart disease  Maternal Grandfather    Hypertension Maternal Grandfather    Heart disease Paternal Grandfather    Colon cancer Neg Hx    Esophageal cancer Neg Hx    Prostate cancer Neg Hx     SOCIAL HISTORY:  reports that he has never smoked. He has never used smokeless tobacco. He reports previous alcohol use. He reports that he does not use drugs.  PERFORMANCE STATUS: The patient's performance status is 1 - Symptomatic but completely ambulatory  PHYSICAL EXAM: Most Recent Vital Signs: There were no vitals taken for this visit. BP 116/76 (BP Location: Left Arm, Patient Position: Sitting)   Pulse 68   Temp 97.8 F (36.6 C) (Oral)   Resp 18   Ht 5\' 9"  (1.753 m)   Wt 193 lb 6.4 oz (87.7 kg)   SpO2 98%   BMI 28.56 kg/m   General Appearance:    Alert, cooperative, no distress, appears stated age  Head:    Normocephalic, without obvious abnormality, atraumatic  Eyes:    PERRL, conjunctiva/corneas clear, EOM's intact, fundi    benign, both eyes             Throat:   Lips, mucosa, and tongue normal; teeth and gums normal  Neck:   Supple, symmetrical, trachea midline, no adenopathy;       thyroid:  No enlargement/tenderness/nodules; no carotid   bruit or JVD  Back:     Symmetric, no curvature, ROM normal, no CVA tenderness  Lungs:     Clear to auscultation bilaterally, respirations unlabored  Chest wall:    No tenderness or deformity  Heart:    Regular rate and rhythm, S1 and S2 normal, no murmur, rub   or gallop  Abdomen:     Soft, non-tender, bowel sounds active all four quadrants,    no masses, no organomegaly        Extremities:   Extremities normal, atraumatic, no cyanosis or edema  Pulses:   2+ and symmetric all extremities  Skin:   Skin color, texture, turgor normal, no rashes or lesions  Lymph nodes:   Cervical, supraclavicular, and axillary nodes normal  Neurologic:   CNII-XII intact. Normal strength, sensation and reflexes      throughout    LABORATORY DATA:  Results for  orders placed or performed in visit on 10/12/20 (from the past 48 hour(s))  CBC with Differential ([facility] Only)     Status: Abnormal   Collection Time: 10/12/20  8:19 AM  Result Value Ref Range   WBC Count 6.7 4.0 - 10.5 K/uL   RBC 5.88 (H) 4.22 - 5.81 MIL/uL   Hemoglobin 17.4 (H) 13.0 - 17.0 g/dL   HCT 50.3 39.0 - 52.0 %   MCV 85.5 80.0 - 100.0 fL   MCH 29.6 26.0 - 34.0 pg   MCHC 34.6 30.0 - 36.0 g/dL   RDW 12.1 11.5 - 15.5 %  Platelet Count 209 150 - 400 K/uL   nRBC 0.0 0.0 - 0.2 %   Neutrophils Relative % 43 %   Neutro Abs 2.9 1.7 - 7.7 K/uL   Lymphocytes Relative 43 %   Lymphs Abs 2.9 0.7 - 4.0 K/uL   Monocytes Relative 9 %   Monocytes Absolute 0.6 0.1 - 1.0 K/uL   Eosinophils Relative 3 %   Eosinophils Absolute 0.2 0.0 - 0.5 K/uL   Basophils Relative 1 %   Basophils Absolute 0.0 0.0 - 0.1 K/uL   Immature Granulocytes 1 %   Abs Immature Granulocytes 0.04 0.00 - 0.07 K/uL    Comment: Performed at [facility] Lab at [facility], [address] [place], [place] [address]  Reticulocytes     Status: Abnormal   Collection Time: 10/12/20  8:19 AM  Result Value Ref Range   Retic Ct Pct 1.5 0.4 - 3.1 %   RBC. 5.93 (H) 4.22 - 5.81 MIL/uL   Retic Count, Absolute 87.2 19.0 - 186.0 K/uL   Immature Retic Fract 6.9 2.3 - 15.9 %    Comment: Performed at [facility] Lab at [facility], [address], [place], [place] [address]  Save Smear [facility])     Status: None   Collection Time: 10/12/20  8:19 AM  Result Value Ref Range   Smear Review SMEAR STAINED AND AVAILABLE FOR REVIEW     Comment: Performed at [facility] Lab at [facility], [address] [place], [place] [address]      RADIOGRAPHY: No results found.     PATHOLOGY: None  ASSESSMENT/PLAN: Mr. Weckerly is a very pleasant 60 yo gentleman with intermittent mild erythrocytosis noted over the last 3 years.  Blood work and smear reviewed with Dr.  Marin Olp. No abnormality or evidence of malignancy noted. Cells are well nucleated and appear mature.  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  Iron studies were also mildly elevated so we will check for hemochromatosis at his next lab draw.  We will have him start taking 1 baby aspirin daily with food.  He is agreeable to phlebotomy every other week x 2 and follow-up in 8 weeks.  We would also suggest that he have a sleep study done as undiagnosed sleep apnea can contribute to the development of erythrocytosis.   All questions were answered. The patient knows to call the clinic with any problems, questions or concerns. We can certainly see the patient much sooner if necessary.  The patient was discussed with Dr. Marin Olp and he is in agreement with the aforementioned.   Laverna Peace, NP

## 2020-10-13 ENCOUNTER — Telehealth: Payer: Self-pay | Admitting: *Deleted

## 2020-10-13 LAB — IRON AND TIBC
Iron: 163 ug/dL (ref 45–182)
Saturation Ratios: 46 % — ABNORMAL HIGH (ref 17.9–39.5)
TIBC: 353 ug/dL (ref 250–450)
UIBC: 190 ug/dL

## 2020-10-13 LAB — ERYTHROPOIETIN: Erythropoietin: 4 m[IU]/mL (ref 2.6–18.5)

## 2020-10-13 LAB — FERRITIN: Ferritin: 296 ng/mL (ref 24–336)

## 2020-10-13 NOTE — Telephone Encounter (Signed)
Called the patient again to inform lab appointment canceled. Had to leave a detailed message.

## 2020-10-13 NOTE — Telephone Encounter (Signed)
Steve Nolan -- did you notify the pt that it was cancelled as well? I am leaving for the day and just want to make sure patient is aware.

## 2020-10-13 NOTE — Telephone Encounter (Signed)
Called left detailed message appt, canceled. Will call back before leaving today to make sure he has gotten the message

## 2020-10-13 NOTE — Telephone Encounter (Signed)
No 10/12/20 LOS

## 2020-10-13 NOTE — Telephone Encounter (Signed)
Please cancel lab appt, ty.

## 2020-10-13 NOTE — Telephone Encounter (Signed)
Lab appt canceled  

## 2020-10-13 NOTE — Telephone Encounter (Signed)
Pt has lab appointment scheduled for Monday morning.  I do not see any future orders in Epic.  Please place future orders if appropriate or call pt to cancel if labs not needed at this time.

## 2020-10-16 ENCOUNTER — Other Ambulatory Visit

## 2020-10-25 ENCOUNTER — Encounter: Payer: Self-pay | Admitting: Family

## 2020-10-25 LAB — JAK 2 V617F (GENPATH)

## 2020-10-26 ENCOUNTER — Telehealth: Payer: Self-pay | Admitting: Family

## 2020-10-26 NOTE — Telephone Encounter (Signed)
I was able to call and speak with the patient about recent blood wok including iron studies and JAK 2 testing. His JAK 2 testing revealed her carries a Tear II variant of potential significance placing him at higher risk of developing MDS or leukemia. His iron saturation was mildly elevated at 46% and ferritin 296.  Lab work reviewed with Dr. Marin Olp prior to calling patient.  We will have him start taking 1 baby aspirin daily with food.  He will also have a phlebotomy every other week x 2 with follow-up in 8 weeks. At that time we will also check for Hemochromatosis. He was also advised to look into having a sleep study done to rule out sleep apnea which can also cause an increase in Hgb.  No other questions at this time. Patient appreciative of call.

## 2020-10-27 ENCOUNTER — Telehealth: Payer: Self-pay | Admitting: *Deleted

## 2020-10-27 NOTE — Telephone Encounter (Signed)
Per 10/12/20 los - called and gave upcoming appointments - requested call back to confirm. Mailed calendar

## 2020-10-30 ENCOUNTER — Encounter: Payer: Self-pay | Admitting: Family Medicine

## 2020-10-30 ENCOUNTER — Ambulatory Visit (INDEPENDENT_AMBULATORY_CARE_PROVIDER_SITE_OTHER): Admitting: Family Medicine

## 2020-10-30 ENCOUNTER — Other Ambulatory Visit: Payer: Self-pay

## 2020-10-30 VITALS — BP 108/68 | HR 78 | Temp 97.9°F | Ht 69.0 in | Wt 196.2 lb

## 2020-10-30 DIAGNOSIS — M7041 Prepatellar bursitis, right knee: Secondary | ICD-10-CM | POA: Diagnosis not present

## 2020-10-30 MED ORDER — METHYLPREDNISOLONE ACETATE 40 MG/ML IJ SUSP
40.0000 mg | Freq: Once | INTRAMUSCULAR | Status: AC
  Administered 2020-10-30: 40 mg via INTRA_ARTICULAR

## 2020-10-30 NOTE — Progress Notes (Signed)
Musculoskeletal Exam  Patient: Steve Nolan DOB: Nov 26, 1960  DOS: 10/30/2020  SUBJECTIVE:  Chief Complaint:   Chief Complaint  Patient presents with   Joint Swelling    Right knee problem    [name] is a 60 y.o.  male for evaluation and treatment of R knee pain.   Onset:  2.5 mo ago. No inj or change in activity.  Location: knee cap Character:  aching  Progression of issue:  has worsened Associated symptoms: swelling Denies: Fevers, redness, bruising, catching/locking Treatment: to date has been ice.   Neurovascular symptoms: no  Past Medical History:  Diagnosis Date   A-fib ([place])    20 years ago,went to cardiologist no treatment needed and no follow-up - dx. torn muscle.   Chronic low back pain 11/11/2016   Kidney stones    Skin cancer     Objective: VITAL SIGNS: BP 108/68   Pulse 78   Temp 97.9 F (36.6 C) (Oral)   Ht '5\' 9"'$  (1.753 m)   Wt 196 lb 4 oz (89 kg)   SpO2 96%   BMI 28.98 kg/m  Constitutional: Well formed, well developed. No acute distress. Thorax & Lungs: No accessory muscle use Musculoskeletal: R knee.   Normal active range of motion: yes.   Normal passive range of motion: yes Tenderness to palpation: yes over patella Deformity: enlargement of prepatellar bursa Ecchymosis: no Tests positive: none Tests negative: Lachman's, Stine's, varus/valgus stress, patellar apprehension/grind Neurologic: Normal sensory function. Gait is nml.  Psychiatric: Normal mood. Age appropriate judgment and insight. Alert & oriented x 3.    Procedure Note; R Knee prepatallar bursa aspiration/injection Verbal consent obtained. The inferior portion of the swollen bursa was marked with an otoscope speculum and cleaned with alcohol Topical freeze spray was then used for topical anesthesia. A 21-gauge needle was used to enter the space at a perpendicular angle to the tibia and a few cc of serous fluid was aspirated. 40 mg of Depomedrol with 2 mL of 1%  lidocaine was injected using the same needle. Several cc of serous fluid was then expelled from the hole created by the needle after the procedure. The patient tolerated the procedure well. There were no other immediate complications noted.  Assessment:  Prepatellar bursitis of right knee - Plan: Ambulatory referral to Sports Medicine, methylPREDNISolone acetate (DEPO-MEDROL) injection 40 mg, PR DRAIN/INJECT LARGE JOINT/BURSA  Plan: Stretches/exercises, heat, ice, Tylenol.  F/u as originally scheduled. The patient voiced understanding and agreement to the plan.   [place], DO 10/30/20  2:43 PM

## 2020-10-30 NOTE — Patient Instructions (Addendum)
Ice/cold pack over area for 10-15 min twice daily.  Activity as tolerated. Don't overdo it.   If you do not hear anything about your referral in the next 1-2 weeks, call our office and ask for an update.  Let us know if you need anything.

## 2020-11-02 ENCOUNTER — Other Ambulatory Visit: Payer: Self-pay

## 2020-11-02 ENCOUNTER — Encounter: Payer: Self-pay | Admitting: Hematology & Oncology

## 2020-11-02 ENCOUNTER — Other Ambulatory Visit: Payer: Self-pay | Admitting: *Deleted

## 2020-11-02 ENCOUNTER — Inpatient Hospital Stay: Attending: Hematology & Oncology

## 2020-11-02 DIAGNOSIS — D751 Secondary polycythemia: Secondary | ICD-10-CM | POA: Insufficient documentation

## 2020-11-02 NOTE — Progress Notes (Signed)
Charlestine Massed presents today for phlebotomy per MD orders. Phlebotomy procedure started at 1013 and ended at 1020. 521 grams removed via 16 gauge needle to Right AC using phlebotomy kit Patient observed for 30 minutes after procedure without any incident. Patient tolerated procedure well. IV needle removed intact.

## 2020-11-02 NOTE — Patient Instructions (Signed)
Therapeutic Phlebotomy Therapeutic phlebotomy is the planned removal of blood from a person's body for the purpose of treating a medical condition. The procedure is similar to donating blood. Usually, about a pint (470 mL, or 0.47 L) of blood is removed.The average adult has 9-12 pints (4.3-5.7 L) of blood in the body. Therapeutic phlebotomy may be used to treat the following medical conditions: Hemochromatosis. This is a condition in which the blood contains too much iron. Polycythemia vera. This is a condition in which the blood contains too many red blood cells. Porphyria cutanea tarda. This is a disease in which an important part of hemoglobin is not made properly. It results in the buildup of abnormal amounts of porphyrins in the body. Sickle cell disease. This is a condition in which the red blood cells form an abnormal crescent shape rather than a round shape. Tell a health care provider about: Any allergies you have. All medicines you are taking, including vitamins, herbs, eye drops, creams, and over-the-counter medicines. Any problems you or family members have had with anesthetic medicines. Any blood disorders you have. Any surgeries you have had. Any medical conditions you have. Whether you are pregnant or may be pregnant. What are the risks? Generally, this is a safe procedure. However, problems may occur, including: Nausea or light-headedness. Low blood pressure (hypotension). Soreness, bleeding, swelling, or bruising at the needle insertion site. Infection. What happens before the procedure? Follow instructions from your health care provider about eating or drinking restrictions. Ask your health care provider about: Changing or stopping your regular medicines. This is especially important if you are taking diabetes medicines or blood thinners (anticoagulants). Taking medicines such as aspirin and ibuprofen. These medicines can thin your blood. Do not take these medicines unless  your health care provider tells you to take them. Taking over-the-counter medicines, vitamins, herbs, and supplements. Wear clothing with sleeves that can be raised above the elbow. Plan to have someone take you home from the hospital or clinic. You may have a blood sample taken. Your blood pressure, pulse rate, and breathing rate will be measured. What happens during the procedure?  To lower your risk of infection: Your health care team will wash or sanitize their hands. Your skin will be cleaned with an antiseptic. You may be given a medicine to numb the area (local anesthetic). A tourniquet will be placed on your arm. A needle will be inserted into one of your veins. Tubing and a collection bag will be attached to that needle. Blood will flow through the needle and tubing into the collection bag. The collection bag will be placed lower than your arm to allow gravity to help the flow of blood into the bag. You may be asked to open and close your hand slowly and continually during the entire collection. After the specified amount of blood has been removed from your body, the collection bag and tubing will be clamped. The needle will be removed from your vein. Pressure will be held on the site of the needle insertion to stop the bleeding. A bandage (dressing) will be placed over the needle insertion site. The procedure may vary among health care providers and hospitals. What happens after the procedure? Your blood pressure, pulse rate, and breathing rate will be measured after the procedure. You will be encouraged to drink fluids. Your recovery will be assessed and monitored. You can return to your normal activities as told by your health care provider. Summary Therapeutic phlebotomy is the planned removal of   blood from a person's body for the purpose of treating a medical condition. Therapeutic phlebotomy may be used to treat hemochromatosis, polycythemia vera, porphyria cutanea tarda,  or sickle cell disease. In the procedure, a needle is inserted and about a pint (470 mL, or 0.47 L) of blood is removed. The average adult has 9-12 pints (4.3-5.7 L) of blood in the body. This is generally a safe procedure, but it can sometimes cause problems such as nausea, light-headedness, or low blood pressure (hypotension). This information is not intended to replace advice given to you by your health care provider. Make sure you discuss any questions you have with your healthcare provider. Document Revised: 03/27/2017 Document Reviewed: 03/27/2017 Elsevier Patient Education  2022 [employer]  

## 2020-11-08 ENCOUNTER — Encounter: Payer: Self-pay | Admitting: Family Medicine

## 2020-11-08 ENCOUNTER — Other Ambulatory Visit: Payer: Self-pay

## 2020-11-08 ENCOUNTER — Ambulatory Visit (INDEPENDENT_AMBULATORY_CARE_PROVIDER_SITE_OTHER): Admitting: Family Medicine

## 2020-11-08 ENCOUNTER — Ambulatory Visit: Payer: Self-pay

## 2020-11-08 VITALS — BP 114/76 | Ht 69.0 in | Wt 196.0 lb

## 2020-11-08 DIAGNOSIS — M222X1 Patellofemoral disorders, right knee: Secondary | ICD-10-CM | POA: Insufficient documentation

## 2020-11-08 DIAGNOSIS — M25561 Pain in right knee: Secondary | ICD-10-CM

## 2020-11-08 MED ORDER — DICLOFENAC SODIUM 1 % EX GEL: 4.0000 g | Freq: Four times a day (QID) | CUTANEOUS | 11 refills | Status: DC

## 2020-11-08 NOTE — Patient Instructions (Signed)
Nice to meet you Please try ice  Please try the exercises  Please try voltaren as needed   Please send me a message in MyChart with any questions or updates.  Please see me back in 4 weeks.   --Dr. Raeford Razor

## 2020-11-08 NOTE — Progress Notes (Signed)
Steve Nolan - 60 y.o. male MRN [id number]  Date of birth: [birth year]  SUBJECTIVE:  Including CC & ROS.  No chief complaint on file.   Steve Nolan is a 60 y.o. male that is presenting with right knee pain.  He was running 3 miles per day.  He was going up and down hills and developed a bursitis.  This was drained and has had ongoing anterior knee pain..   Review of Systems See HPI   HISTORY: Past Medical, Surgical, Social, and Family History Reviewed & Updated per EMR.   Pertinent Historical Findings include:  Past Medical History:  Diagnosis Date   A-fib ([place])    20 years ago,went to cardiologist no treatment needed and no follow-up - dx. torn muscle.   Chronic low back pain 11/11/2016   Kidney stones    Skin cancer     Past Surgical History:  Procedure Laterality Date   COLONOSCOPY     around 2011. Dr Otho Darner. [facility]. Michela Pitcher he was due in 2021.    ESOPHAGOGASTRODUODENOSCOPY     Around 2011   EXTRACORPOREAL SHOCK WAVE LITHOTRIPSY Left 01/09/2017   Procedure: LEFT EXTRACORPOREAL SHOCK WAVE LITHOTRIPSY (ESWL);  Surgeon: Cleon Gustin, MD;  Location: [facility];  Service: Urology;  Laterality: Left;   FUNCTIONAL ENDOSCOPIC SINUS SURGERY Bilateral 1998   deviated septum   left shoulder surgery Left    arthoscopic    Family History  Problem Relation Age of Onset   Hypertension Mother    Diabetes Mother    Non-Hodgkin's lymphoma Mother    Heart disease Father    Skin cancer Father    Lung cancer Sister    Non-Hodgkin's lymphoma Sister    Stroke Maternal Grandmother    Hypertension Maternal Grandmother    Drug abuse Maternal Grandmother    Heart disease Maternal Grandfather    Hypertension Maternal Grandfather    Heart disease Paternal Grandfather    Colon cancer Neg Hx    Esophageal cancer Neg Hx    Prostate cancer Neg Hx     Social History   Socioeconomic History   Marital status: Divorced    Spouse name: Not on file   Number of  children: Not on file   Years of education: Not on file   Highest education level: Not on file  Occupational History   Not on file  Tobacco Use   Smoking status: Never   Smokeless tobacco: Never  Vaping Use   Vaping Use: Never used  Substance and Sexual Activity   Alcohol use: Not Currently    Comment: ocassionally    Drug use: No   Sexual activity: Not on file  Other Topics Concern   Not on file  Social History Narrative   Not on file   Social Determinants of Health   Financial Resource Strain: Not on file  Food Insecurity: Not on file  Transportation Needs: Not on file  Physical Activity: Not on file  Stress: Not on file  Social Connections: Not on file  Intimate Partner Violence: Not on file     PHYSICAL EXAM:  VS: BP 114/76 (BP Location: Left Arm, Patient Position: Sitting, Cuff Size: Normal)   Ht '5\' 9"'$  (1.753 m)   Wt 196 lb (88.9 kg)   BMI 28.94 kg/m  Physical Exam Gen: NAD, alert, cooperative with exam, well-appearing MSK:  Right knee: Normal range of motion. Normal strength resistance. No instability. Neurovascularly intact  Limited ultrasound: Right knee:  Trace effusion. Normal appearing quadricep tendon. Mild hypoechoic change in the prepatellar area from resolving bursitis. No change of the patellar tendon. Normal-appearing medial joint space and lateral joint space  Summary: Findings from prepatellar bursitis evident.  Ultrasound and interpretation by Clearance Coots, MD    ASSESSMENT & PLAN:   Patellofemoral pain syndrome of right knee Symptoms today that are more anterior seem consistent with patellofemoral syndrome.  The residual changes in the anterior aspect of the knee from the bursitis are appreciated -Counseled on home exercise therapy and supportive care. -Voltaren gel -Could consider physical therapy.

## 2020-11-08 NOTE — Assessment & Plan Note (Signed)
Symptoms today that are more anterior seem consistent with patellofemoral syndrome.  The residual changes in the anterior aspect of the knee from the bursitis are appreciated -Counseled on home exercise therapy and supportive care. -Voltaren gel -Could consider physical therapy.

## 2020-11-16 ENCOUNTER — Other Ambulatory Visit: Payer: Self-pay

## 2020-11-16 ENCOUNTER — Inpatient Hospital Stay

## 2020-11-16 VITALS — BP 111/78 | HR 68 | Temp 98.0°F | Resp 17

## 2020-11-16 DIAGNOSIS — D751 Secondary polycythemia: Secondary | ICD-10-CM

## 2020-11-16 NOTE — Patient Instructions (Signed)
Therapeutic Phlebotomy Therapeutic phlebotomy is the planned removal of blood from a person's body for the purpose of treating a medical condition. The procedure is similar to donating blood. Usually, about a pint (470 mL, or 0.47 L) of blood is removed.The average adult has 9-12 pints (4.3-5.7 L) of blood in the body. Therapeutic phlebotomy may be used to treat the following medical conditions: Hemochromatosis. This is a condition in which the blood contains too much iron. Polycythemia vera. This is a condition in which the blood contains too many red blood cells. Porphyria cutanea tarda. This is a disease in which an important part of hemoglobin is not made properly. It results in the buildup of abnormal amounts of porphyrins in the body. Sickle cell disease. This is a condition in which the red blood cells form an abnormal crescent shape rather than a round shape. Tell a health care provider about: Any allergies you have. All medicines you are taking, including vitamins, herbs, eye drops, creams, and over-the-counter medicines. Any problems you or family members have had with anesthetic medicines. Any blood disorders you have. Any surgeries you have had. Any medical conditions you have. Whether you are pregnant or may be pregnant. What are the risks? Generally, this is a safe procedure. However, problems may occur, including: Nausea or light-headedness. Low blood pressure (hypotension). Soreness, bleeding, swelling, or bruising at the needle insertion site. Infection. What happens before the procedure? Follow instructions from your health care provider about eating or drinking restrictions. Ask your health care provider about: Changing or stopping your regular medicines. This is especially important if you are taking diabetes medicines or blood thinners (anticoagulants). Taking medicines such as aspirin and ibuprofen. These medicines can thin your blood. Do not take these medicines unless  your health care provider tells you to take them. Taking over-the-counter medicines, vitamins, herbs, and supplements. Wear clothing with sleeves that can be raised above the elbow. Plan to have someone take you home from the hospital or clinic. You may have a blood sample taken. Your blood pressure, pulse rate, and breathing rate will be measured. What happens during the procedure?  To lower your risk of infection: Your health care team will wash or sanitize their hands. Your skin will be cleaned with an antiseptic. You may be given a medicine to numb the area (local anesthetic). A tourniquet will be placed on your arm. A needle will be inserted into one of your veins. Tubing and a collection bag will be attached to that needle. Blood will flow through the needle and tubing into the collection bag. The collection bag will be placed lower than your arm to allow gravity to help the flow of blood into the bag. You may be asked to open and close your hand slowly and continually during the entire collection. After the specified amount of blood has been removed from your body, the collection bag and tubing will be clamped. The needle will be removed from your vein. Pressure will be held on the site of the needle insertion to stop the bleeding. A bandage (dressing) will be placed over the needle insertion site. The procedure may vary among health care providers and hospitals. What happens after the procedure? Your blood pressure, pulse rate, and breathing rate will be measured after the procedure. You will be encouraged to drink fluids. Your recovery will be assessed and monitored. You can return to your normal activities as told by your health care provider. Summary Therapeutic phlebotomy is the planned removal of   blood from a person's body for the purpose of treating a medical condition. Therapeutic phlebotomy may be used to treat hemochromatosis, polycythemia vera, porphyria cutanea tarda,  or sickle cell disease. In the procedure, a needle is inserted and about a pint (470 mL, or 0.47 L) of blood is removed. The average adult has 9-12 pints (4.3-5.7 L) of blood in the body. This is generally a safe procedure, but it can sometimes cause problems such as nausea, light-headedness, or low blood pressure (hypotension). This information is not intended to replace advice given to you by your health care provider. Make sure you discuss any questions you have with your healthcare provider. Document Revised: 03/27/2017 Document Reviewed: 03/27/2017 Elsevier Patient Education  2022 [employer]  

## 2020-11-16 NOTE — Progress Notes (Signed)
Steve Nolan presents today for phlebotomy per MD orders. Phlebotomy procedure started at 1008 and ended at 1013. 515 grams removed via 16 gauge needle to Left AC. Phlebotomy kit used for procedure.  Patient declined to stay  for 30 minutes after procedure. Pt stated he has tolerated procedure without difficulty prior.  Patient tolerated procedure well. IV needle removed intact. Pt declined any snacks or fluids while in clinic. Pt stated he had a good breakfast prior to arrival in clinic.

## 2020-12-04 ENCOUNTER — Encounter

## 2020-12-04 ENCOUNTER — Inpatient Hospital Stay: Attending: Hematology & Oncology

## 2020-12-04 ENCOUNTER — Other Ambulatory Visit: Payer: Self-pay

## 2020-12-04 ENCOUNTER — Encounter: Payer: Self-pay | Admitting: Family

## 2020-12-04 ENCOUNTER — Telehealth: Payer: Self-pay | Admitting: *Deleted

## 2020-12-04 ENCOUNTER — Inpatient Hospital Stay (HOSPITAL_BASED_OUTPATIENT_CLINIC_OR_DEPARTMENT_OTHER): Admitting: Family

## 2020-12-04 VITALS — BP 133/69 | HR 84 | Temp 98.6°F | Resp 19 | Ht 69.0 in | Wt 194.0 lb

## 2020-12-04 DIAGNOSIS — Z79899 Other long term (current) drug therapy: Secondary | ICD-10-CM | POA: Diagnosis not present

## 2020-12-04 DIAGNOSIS — D751 Secondary polycythemia: Secondary | ICD-10-CM | POA: Insufficient documentation

## 2020-12-04 LAB — CMP (CANCER CENTER ONLY)
ALT: 29 U/L (ref 0–44)
AST: 29 U/L (ref 15–41)
Albumin: 4.1 g/dL (ref 3.5–5.0)
Alkaline Phosphatase: 60 U/L (ref 38–126)
Anion gap: 8 (ref 5–15)
BUN: 13 mg/dL (ref 8–23)
CO2: 25 mmol/L (ref 22–32)
Calcium: 8.8 mg/dL — ABNORMAL LOW (ref 8.9–10.3)
Chloride: 105 mmol/L (ref 98–111)
Creatinine: 0.85 mg/dL (ref 0.61–1.24)
GFR, Estimated: >60 mL/min (ref >60)
Glucose, Bld: 139 mg/dL — ABNORMAL HIGH (ref 70–99)
Potassium: 3.8 mmol/L (ref 3.5–5.1)
Sodium: 138 mmol/L (ref 135–145)
Total Bilirubin: 0.5 mg/dL (ref 0.3–1.2)
Total Protein: 7 g/dL (ref 6.5–8.1)

## 2020-12-04 LAB — CBC WITH DIFFERENTIAL (CANCER CENTER ONLY)
Abs Immature Granulocytes: 0.05 10*3/uL (ref 0.00–0.07)
Basophils Absolute: 0 10*3/uL (ref 0.0–0.1)
Basophils Relative: 0 %
Eosinophils Absolute: 0.2 10*3/uL (ref 0.0–0.5)
Eosinophils Relative: 3 %
HCT: 46.4 % (ref 39.0–52.0)
Hemoglobin: 15.8 g/dL (ref 13.0–17.0)
Immature Granulocytes: 1 %
Lymphocytes Relative: 49 %
Lymphs Abs: 3 10*3/uL (ref 0.7–4.0)
MCH: 29.8 pg (ref 26.0–34.0)
MCHC: 34.1 g/dL (ref 30.0–36.0)
MCV: 87.4 fL (ref 80.0–100.0)
Monocytes Absolute: 0.5 10*3/uL (ref 0.1–1.0)
Monocytes Relative: 8 %
Neutro Abs: 2.4 10*3/uL (ref 1.7–7.7)
Neutrophils Relative %: 39 %
Platelet Count: 218 10*3/uL (ref 150–400)
RBC: 5.31 MIL/uL (ref 4.22–5.81)
RDW: 13 % (ref 11.5–15.5)
WBC Count: 6.1 10*3/uL (ref 4.0–10.5)
nRBC: 0 % (ref 0.0–0.2)

## 2020-12-04 LAB — IRON AND TIBC
Iron: 161 ug/dL (ref 42–163)
Saturation Ratios: 50 % (ref 20–55)
TIBC: 324 ug/dL (ref 202–409)
UIBC: 163 ug/dL (ref 117–376)

## 2020-12-04 LAB — RETICULOCYTES
Immature Retic Fract: 11 % (ref 2.3–15.9)
RBC.: 5.25 MIL/uL (ref 4.22–5.81)
Retic Count, Absolute: 126 10*3/uL (ref 19.0–186.0)
Retic Ct Pct: 2.4 % (ref 0.4–3.1)

## 2020-12-04 LAB — FERRITIN: Ferritin: 151 ng/mL (ref 24–336)

## 2020-12-04 NOTE — Progress Notes (Addendum)
Hematology and Oncology Follow Up Visit  JAYDAN LOTITO [id number] [birth year] 60 y.o. 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 Borchers is here today for follow-up. He is doing fairly well. He notes fatigue at times.  He has found that the numbness and tingling in his feet has resolved since having the 2 phlebotomies. No swelling or tenderness in his extremities. Pedal pulses are 2 +.  No falls or syncope to report. He has maintained a good appetite and is staying well hydrated. His weight is stable.  He has a dry cough and has noted this for several weeks.  No fever, chills, n/v, rash, dizziness, SOB, chest pain, palpitations, abdominal pain or changes in bowel or bladder habits.  He states that with his history of stomach ulcers he did not want to take aspirin and has been taking Omega 3 instead.  No blood loss noted. No abnormal bruising, no petechiae.   ECOG Performance Status: 1 - Symptomatic but completely ambulatory  Medications:  Allergies as of 12/04/2020       Reactions   Sulfa Antibiotics Rash        Medication List        Accurate as of December 04, 2020  8:32 AM. If you have any questions, ask your nurse or doctor.          diclofenac Sodium 1 % Gel Commonly known as: VOLTAREN Apply 4 g topically 4 (four) times daily. To affected joint.   tadalafil 5 MG tablet Commonly known as: CIALIS Take 1 tablet (5 mg total) by mouth daily.   VITAMIN B 12 PO Place 1 tablet under the tongue once a week.        Allergies:  Allergies  Allergen Reactions   Sulfa Antibiotics Rash    Past Medical History, Surgical history, Social history, and Family History were reviewed and updated.  Review of Systems: All other 10 point review of systems is  negative.   Physical Exam:  vitals were not taken for this visit.   Wt Readings from Last 3 Encounters:  11/08/20 196 lb (88.9 kg)  10/30/20 196 lb 4 oz (89 kg)  10/12/20 193 lb 6.4 oz (87.7 kg)    Ocular: Sclerae unicteric, pupils equal, round and reactive to light Ear-nose-throat: Oropharynx clear, dentition fair Lymphatic: No cervical or supraclavicular adenopathy Lungs no rales or rhonchi, good excursion bilaterally Heart regular rate and rhythm, no murmur appreciated Abd soft, nontender, positive bowel sounds MSK no focal spinal tenderness, no joint edema Neuro: non-focal, well-oriented, appropriate affect Breasts: Deferred   Lab Results  Component Value Date   WBC 6.7 10/12/2020   HGB 17.4 (H) 10/12/2020   HCT 50.3 10/12/2020   MCV 85.5 10/12/2020   PLT 209 10/12/2020   Lab Results  Component Value Date   FERRITIN 296 10/12/2020   IRON 163 10/12/2020   TIBC 353 10/12/2020   UIBC 190 10/12/2020   IRONPCTSAT 46 (H) 10/12/2020   Lab Results  Component Value Date   RETICCTPCT 1.5 10/12/2020   RBC 5.88 (H) 10/12/2020   RBC 5.93 (H) 10/12/2020   No results found for: KPAFRELGTCHN, LAMBDASER, KAPLAMBRATIO No results found for: IGGSERUM, IGA, IGMSERUM No results found for: TOTALPROTELP, ALBUMINELP, A1GS, A2GS, BETS, BETA2SER, [place], MSPIKE, SPEI  Chemistry      Component Value Date/Time   NA 140 10/12/2020 0819   K 4.3 10/12/2020 0819   CL 102 10/12/2020 0819   CO2 30 10/12/2020 0819   BUN 11 10/12/2020 0819   CREATININE 0.96 10/12/2020 0819      Component Value Date/Time   CALCIUM 9.7 10/12/2020 0819   ALKPHOS 59 10/12/2020 0819   AST 29 10/12/2020 0819   ALT 31 10/12/2020 0819   BILITOT 0.8 10/12/2020 0819       Impression and Plan: Mr. Sween is a very pleasant 60 yo gentleman with intermittent mild erythrocytosis. 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  Hemochromatosis DNA pending. Will hold off on phlebotomy today.  We will check labs again in 6 weeks (phlebotomize at that time if needed) and follow-up in 3 months. He can contact our office with any questions or concerns.   Lottie Dawson, NP 9/12/20228:32 AM

## 2020-12-04 NOTE — Telephone Encounter (Signed)
Per 12/04/20 los - gave upcoming appointments - confirmed

## 2020-12-07 LAB — HEMOCHROMATOSIS DNA-PCR(C282Y,H63D)

## 2020-12-11 ENCOUNTER — Other Ambulatory Visit: Payer: Self-pay

## 2020-12-11 ENCOUNTER — Ambulatory Visit (INDEPENDENT_AMBULATORY_CARE_PROVIDER_SITE_OTHER): Admitting: Family Medicine

## 2020-12-11 DIAGNOSIS — M222X1 Patellofemoral disorders, right knee: Secondary | ICD-10-CM

## 2020-12-11 NOTE — Progress Notes (Signed)
Steve Nolan - 60 y.o. male MRN [id number]  Date of birth: 09-13-1960  SUBJECTIVE:  Including CC & ROS.  No chief complaint on file.   Steve Nolan is a 60 y.o. male that is following up for his right knee pain.  He has been doing well and has minimal pain.  He has been exercising but has not started running.   Review of Systems See HPI   HISTORY: Past Medical, Surgical, Social, and Family History Reviewed & Updated per EMR.   Pertinent Historical Findings include:  Past Medical History:  Diagnosis Date   A-fib ([place])    20 years ago,went to cardiologist no treatment needed and no follow-up - dx. torn muscle.   Chronic low back pain 11/11/2016   Kidney stones    Skin cancer     Past Surgical History:  Procedure Laterality Date   COLONOSCOPY     around 2011. Dr Otho Darner. [facility]. Michela Pitcher he was due in 2021.    ESOPHAGOGASTRODUODENOSCOPY     Around 2011   EXTRACORPOREAL SHOCK WAVE LITHOTRIPSY Left 01/09/2017   Procedure: LEFT EXTRACORPOREAL SHOCK WAVE LITHOTRIPSY (ESWL);  Surgeon: Cleon Gustin, MD;  Location: [facility];  Service: Urology;  Laterality: Left;   FUNCTIONAL ENDOSCOPIC SINUS SURGERY Bilateral 1998   deviated septum   left shoulder surgery Left    arthoscopic    Family History  Problem Relation Age of Onset   Hypertension Mother    Diabetes Mother    Non-Hodgkin's lymphoma Mother    Heart disease Father    Skin cancer Father    Lung cancer Sister    Non-Hodgkin's lymphoma Sister    Stroke Maternal Grandmother    Hypertension Maternal Grandmother    Drug abuse Maternal Grandmother    Heart disease Maternal Grandfather    Hypertension Maternal Grandfather    Heart disease Paternal Grandfather    Colon cancer Neg Hx    Esophageal cancer Neg Hx    Prostate cancer Neg Hx     Social History   Socioeconomic History   Marital status: Divorced    Spouse name: Not on file   Number of children: Not on file   Years of education: Not on  file   Highest education level: Not on file  Occupational History   Not on file  Tobacco Use   Smoking status: Never   Smokeless tobacco: Never  Vaping Use   Vaping Use: Never used  Substance and Sexual Activity   Alcohol use: Not Currently    Comment: ocassionally    Drug use: No   Sexual activity: Not on file  Other Topics Concern   Not on file  Social History Narrative   Not on file   Social Determinants of Health   Financial Resource Strain: Not on file  Food Insecurity: Not on file  Transportation Needs: Not on file  Physical Activity: Not on file  Stress: Not on file  Social Connections: Not on file  Intimate Partner Violence: Not on file     PHYSICAL EXAM:  VS: Ht 5\' 9"  (1.753 m)   Wt 195 lb (88.5 kg)   BMI 28.80 kg/m  Physical Exam Gen: NAD, alert, cooperative with exam, well-appearing      ASSESSMENT & PLAN:   Patellofemoral pain syndrome of right knee Has had improvement with no loss of range of motion.  Denies any significant pain today. -Counseled on home exercise therapy and supportive care. -Could consider shockwave therapy.

## 2020-12-11 NOTE — Assessment & Plan Note (Signed)
Has had improvement with no loss of range of motion.  Denies any significant pain today. -Counseled on home exercise therapy and supportive care. -Could consider shockwave therapy.

## 2020-12-18 ENCOUNTER — Telehealth: Payer: Self-pay

## 2020-12-18 NOTE — Telephone Encounter (Signed)
-----   Message from Celso Amy, NP sent at 12/15/2020  3:59 PM EDT ----- Hemochromatosis DNA was negative!!! WOO HOO!!!! No changes :)  Sarah  ----- Message ----- From: Buel Ream, [facility] [place] Sent: 12/04/2020   8:39 AM EDT To: Celso Amy, NP

## 2021-01-15 ENCOUNTER — Other Ambulatory Visit: Payer: Self-pay

## 2021-01-15 ENCOUNTER — Inpatient Hospital Stay: Attending: Hematology & Oncology

## 2021-01-15 DIAGNOSIS — D751 Secondary polycythemia: Secondary | ICD-10-CM | POA: Insufficient documentation

## 2021-01-15 LAB — CBC WITH DIFFERENTIAL (CANCER CENTER ONLY)
Abs Immature Granulocytes: 0.01 10*3/uL (ref 0.00–0.07)
Basophils Absolute: 0 10*3/uL (ref 0.0–0.1)
Basophils Relative: 0 %
Eosinophils Absolute: 0.1 10*3/uL (ref 0.0–0.5)
Eosinophils Relative: 3 %
HCT: 48 % (ref 39.0–52.0)
Hemoglobin: 16.6 g/dL (ref 13.0–17.0)
Immature Granulocytes: 0 %
Lymphocytes Relative: 47 %
Lymphs Abs: 2.6 10*3/uL (ref 0.7–4.0)
MCH: 30.1 pg (ref 26.0–34.0)
MCHC: 34.6 g/dL (ref 30.0–36.0)
MCV: 87 fL (ref 80.0–100.0)
Monocytes Absolute: 0.5 10*3/uL (ref 0.1–1.0)
Monocytes Relative: 9 %
Neutro Abs: 2.2 10*3/uL (ref 1.7–7.7)
Neutrophils Relative %: 41 %
Platelet Count: 205 10*3/uL (ref 150–400)
RBC: 5.52 MIL/uL (ref 4.22–5.81)
RDW: 12.1 % (ref 11.5–15.5)
WBC Count: 5.4 10*3/uL (ref 4.0–10.5)
nRBC: 0 % (ref 0.0–0.2)

## 2021-01-15 LAB — CMP (CANCER CENTER ONLY)
ALT: 22 U/L (ref 0–44)
AST: 21 U/L (ref 15–41)
Albumin: 4.2 g/dL (ref 3.5–5.0)
Alkaline Phosphatase: 60 U/L (ref 38–126)
Anion gap: 7 (ref 5–15)
BUN: 12 mg/dL (ref 8–23)
CO2: 28 mmol/L (ref 22–32)
Calcium: 9.6 mg/dL (ref 8.9–10.3)
Chloride: 104 mmol/L (ref 98–111)
Creatinine: 0.85 mg/dL (ref 0.61–1.24)
GFR, Estimated: >60 mL/min (ref >60)
Glucose, Bld: 105 mg/dL — ABNORMAL HIGH (ref 70–99)
Potassium: 3.9 mmol/L (ref 3.5–5.1)
Sodium: 139 mmol/L (ref 135–145)
Total Bilirubin: 0.5 mg/dL (ref 0.3–1.2)
Total Protein: 6.6 g/dL (ref 6.5–8.1)

## 2021-01-15 LAB — IRON AND TIBC
Iron: 104 ug/dL (ref 45–182)
Saturation Ratios: 28 % (ref 17.9–39.5)
TIBC: 365 ug/dL (ref 250–450)
UIBC: 261 ug/dL

## 2021-01-15 LAB — FERRITIN: Ferritin: 90 ng/mL (ref 24–336)

## 2021-03-02 ENCOUNTER — Other Ambulatory Visit: Payer: Self-pay | Admitting: Family

## 2021-03-02 DIAGNOSIS — D751 Secondary polycythemia: Secondary | ICD-10-CM

## 2021-03-05 ENCOUNTER — Inpatient Hospital Stay (HOSPITAL_BASED_OUTPATIENT_CLINIC_OR_DEPARTMENT_OTHER): Admitting: Family

## 2021-03-05 ENCOUNTER — Inpatient Hospital Stay: Attending: Hematology & Oncology

## 2021-03-05 ENCOUNTER — Other Ambulatory Visit: Payer: Self-pay

## 2021-03-05 ENCOUNTER — Encounter: Payer: Self-pay | Admitting: Family

## 2021-03-05 ENCOUNTER — Inpatient Hospital Stay

## 2021-03-05 DIAGNOSIS — D751 Secondary polycythemia: Secondary | ICD-10-CM | POA: Diagnosis not present

## 2021-03-05 LAB — IRON AND TIBC
Iron: 76 ug/dL (ref 42–163)
Saturation Ratios: 23 % (ref 20–55)
TIBC: 331 ug/dL (ref 202–409)
UIBC: 255 ug/dL (ref 117–376)

## 2021-03-05 LAB — CBC WITH DIFFERENTIAL (CANCER CENTER ONLY)
Abs Immature Granulocytes: 0.03 10*3/uL (ref 0.00–0.07)
Basophils Absolute: 0 10*3/uL (ref 0.0–0.1)
Basophils Relative: 0 %
Eosinophils Absolute: 0.2 10*3/uL (ref 0.0–0.5)
Eosinophils Relative: 2 %
HCT: 52.2 % — ABNORMAL HIGH (ref 39.0–52.0)
Hemoglobin: 18 g/dL — ABNORMAL HIGH (ref 13.0–17.0)
Immature Granulocytes: 0 %
Lymphocytes Relative: 34 %
Lymphs Abs: 3.3 10*3/uL (ref 0.7–4.0)
MCH: 29.4 pg (ref 26.0–34.0)
MCHC: 34.5 g/dL (ref 30.0–36.0)
MCV: 85.3 fL (ref 80.0–100.0)
Monocytes Absolute: 0.7 10*3/uL (ref 0.1–1.0)
Monocytes Relative: 8 %
Neutro Abs: 5.3 10*3/uL (ref 1.7–7.7)
Neutrophils Relative %: 56 %
Platelet Count: 229 10*3/uL (ref 150–400)
RBC: 6.12 MIL/uL — ABNORMAL HIGH (ref 4.22–5.81)
RDW: 12 % (ref 11.5–15.5)
WBC Count: 9.6 10*3/uL (ref 4.0–10.5)
nRBC: 0 % (ref 0.0–0.2)

## 2021-03-05 LAB — CMP (CANCER CENTER ONLY)
ALT: 19 U/L (ref 0–44)
AST: 18 U/L (ref 15–41)
Albumin: 4.2 g/dL (ref 3.5–5.0)
Alkaline Phosphatase: 74 U/L (ref 38–126)
Anion gap: 6 (ref 5–15)
BUN: 11 mg/dL (ref 8–23)
CO2: 30 mmol/L (ref 22–32)
Calcium: 9.5 mg/dL (ref 8.9–10.3)
Chloride: 101 mmol/L (ref 98–111)
Creatinine: 0.92 mg/dL (ref 0.61–1.24)
GFR, Estimated: >60 mL/min (ref >60)
Glucose, Bld: 148 mg/dL — ABNORMAL HIGH (ref 70–99)
Potassium: 4.3 mmol/L (ref 3.5–5.1)
Sodium: 137 mmol/L (ref 135–145)
Total Bilirubin: 0.7 mg/dL (ref 0.3–1.2)
Total Protein: 6.7 g/dL (ref 6.5–8.1)

## 2021-03-05 LAB — FERRITIN: Ferritin: 139 ng/mL (ref 24–336)

## 2021-03-05 NOTE — Progress Notes (Signed)
Hematology and Oncology Follow Up Visit  Steve Nolan [id number] 05-12-1960 60 y.o. 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 He notes neuropathy in his feet, fatigue, dizziness and "brain fog".  Hct is 52.2, Hgb 18.0, platelets 229 and WBC count 9.6.  LFT's and kidney function testing is within normal limits.  No fever, chills, n/v, cough, rash, SOB, chest pain, palpitations, abdominal pain or changes in bowel or bladder habits.  No swelling noted in his extremities.  Pedal pulses are 2+.  No falls or syncope reported.  He has a good appetite and is doing his best to stay well hydrated. His weight is stable at 193 lbs.  ECOG Performance Status: 1 - Symptomatic but completely ambulatory  Medications:  Allergies as of 03/05/2021       Reactions   Sulfa Antibiotics Rash        Medication List        Accurate as of March 05, 2021 12:43 PM. If you have any questions, ask your nurse or doctor.          diclofenac Sodium 1 % Gel Commonly known as: VOLTAREN Apply 4 g topically 4 (four) times daily. To affected joint.   tadalafil 5 MG tablet Commonly known as: CIALIS Take 1 tablet (5 mg total) by mouth daily.   VITAMIN B 12 PO Place 1 tablet under the tongue once a week.        Allergies:  Allergies  Allergen Reactions   Sulfa Antibiotics Rash    Past Medical History, Surgical history, Social history, and Family History were reviewed and updated.  Review of Systems: All other 10 point review of systems is negative.   Physical Exam:  height is 5\' 9"  (1.753 m) and weight is 193 lb 12.8 oz (87.9 kg). His oral temperature is 98.3 F (36.8 C). His blood pressure is 131/87 and his pulse is 81. His  respiration is 17 and oxygen saturation is 96%.   Wt Readings from Last 3 Encounters:  03/05/21 193 lb 12.8 oz (87.9 kg)  12/11/20 195 lb (88.5 kg)  12/04/20 194 lb 0.6 oz (88 kg)    Ocular: Sclerae unicteric, pupils equal, round and reactive to light Ear-nose-throat: Oropharynx clear, dentition fair Lymphatic: No cervical or supraclavicular adenopathy Lungs no rales or rhonchi, good excursion bilaterally Heart regular rate and rhythm, no murmur appreciated Abd soft, nontender, positive bowel sounds, no liver or spleen tip palpated on exam, no fluid wave  MSK no focal spinal tenderness, no joint edema Neuro: non-focal, well-oriented, appropriate affect Breasts: Deferred   Lab Results  Component Value Date   WBC 9.6 03/05/2021   HGB 18.0 (H) 03/05/2021   HCT 52.2 (H) 03/05/2021   MCV 85.3 03/05/2021   PLT 229 03/05/2021   Lab Results  Component Value Date   FERRITIN 90 01/15/2021   IRON 104 01/15/2021   TIBC 365 01/15/2021   UIBC 261 01/15/2021   IRONPCTSAT 28 01/15/2021   Lab Results  Component Value Date   RETICCTPCT 2.4 12/04/2020   RBC 6.12 (H) 03/05/2021   No results found for: KPAFRELGTCHN, LAMBDASER, KAPLAMBRATIO No results found for: IGGSERUM, IGA, IGMSERUM No results found for: TOTALPROTELP, ALBUMINELP, A1GS,  [name], SPEI   Chemistry      Component Value Date/Time   NA 137 03/05/2021 0756   K 4.3 03/05/2021 0756   CL 101 03/05/2021 0756   CO2 30 03/05/2021 0756   BUN 11 03/05/2021 0756   CREATININE 0.92 03/05/2021 0756      Component Value Date/Time   CALCIUM 9.5 03/05/2021 0756   ALKPHOS 74 03/05/2021 0756   AST 18 03/05/2021 0756   ALT 19 03/05/2021 0756   BILITOT 0.7 03/05/2021 0756       Impression and Plan: Steve Nolan is a very pleasant 60 yo gentleman with intermittent mild erythrocytosis. 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  Hemochromatosis DNA testing was negative.  We will proceed with phlebotomy today and again next week.  He will have lab check monthly with phlebotomy and follow-up in 3 months.   Lottie Dawson, NP 12/12/202212:43 PM

## 2021-03-05 NOTE — Progress Notes (Signed)
Pt declined to stay for full post procedure observation period. Pt stated he has had procedure multiple times prior without difficulty. Vitals updated and stable. Pt left ambulatory in no apparent distress.

## 2021-03-05 NOTE — Patient Instructions (Signed)

## 2021-03-06 ENCOUNTER — Encounter: Payer: Self-pay | Admitting: Family

## 2021-03-08 ENCOUNTER — Telehealth: Payer: Self-pay | Admitting: *Deleted

## 2021-03-08 NOTE — Telephone Encounter (Signed)
Per 03/05/21 los - called and gave upcoming appointments - confirmed

## 2021-03-13 ENCOUNTER — Inpatient Hospital Stay

## 2021-03-13 ENCOUNTER — Other Ambulatory Visit: Payer: Self-pay

## 2021-03-13 VITALS — BP 132/71

## 2021-03-13 DIAGNOSIS — D751 Secondary polycythemia: Secondary | ICD-10-CM | POA: Diagnosis not present

## 2021-03-13 NOTE — Patient Instructions (Signed)
Therapeutic Phlebotomy °Therapeutic phlebotomy is the planned removal of blood from a person's body for the purpose of treating a medical condition. The procedure is lot like donating blood. Usually, about a pint (470 mL, or 0.47 L) of blood is removed. The average adult has 9-12 pints (4.3-5.7 L) of blood in his or her body. °Therapeutic phlebotomy may be used to treat the following medical conditions: °Hemochromatosis. This is a condition in which the blood contains too much iron. °Polycythemia vera. This is a condition in which the blood contains too many red blood cells. °Porphyria cutanea tarda. This is a disease in which an important part of hemoglobin is not made properly. It results in the buildup of abnormal amounts of porphyrins in the body. °Sickle cell disease. This is a condition in which the red blood cells form an abnormal crescent shape rather than a round shape. °Tell a health care provider about: °Any allergies you have. °All medicines you are taking, including vitamins, herbs, eye drops, creams, and over-the-counter medicines. °Any bleeding problems you have. °Any surgeries you have had. °Any medical conditions you have. °Whether you are pregnant or may be pregnant. °What are the risks? °Generally, this is a safe procedure. However, problems may occur, including: °Nausea or light-headedness. °Low blood pressure (hypotension). °Soreness, bleeding, swelling, or bruising at the needle insertion site. °Infection. °What happens before the procedure? °Ask your health care provider about: °Changing or stopping your regular medicines. This is especially important if you are taking diabetes medicines or blood thinners. °Taking medicines such as aspirin and ibuprofen. These medicines can thin your blood. Do not take these medicines unless your health care provider tells you to take them. °Taking over-the-counter medicines, vitamins, herbs, and supplements. °Wear clothing with sleeves that can be raised  above the elbow. °You may have a blood sample taken. °Your blood pressure, pulse rate, and breathing rate will be measured. °What happens during the procedure? ° °You may be given a medicine to numb the area (local anesthetic). °A tourniquet will be placed on your arm. °A needle will be put into one of your veins. °Tubing and a collection bag will be attached to the needle. °Blood will flow through the needle and tubing into the collection bag. °The collection bag will be placed lower than your arm so gravity can help the blood flow into the bag. °You may be asked to open and close your hand slowly and continually during the entire collection. °After the specified amount of blood has been removed from your body, the collection bag and tubing will be clamped. °The needle will be removed from your vein. °Pressure will be held on the needle site to stop the bleeding. °A bandage (dressing) will be placed over the needle insertion site. °The procedure may vary among health care providers and hospitals. °What happens after the procedure? °Your blood pressure, pulse rate, and breathing rate will be measured after the procedure. °You will be encouraged to drink fluids. °You will be encouraged to eat a snack to prevent a low blood sugar level. °Your recovery will be assessed and monitored. °Return to your normal activities as told by your health care provider. °Summary °Therapeutic phlebotomy is the planned removal of blood from a person's body for the purpose of treating a medical condition. °Therapeutic phlebotomy may be used to treat hemochromatosis, polycythemia vera, porphyria cutanea tarda, or sickle cell disease. °In the procedure, a needle is inserted and about a pint (470 mL, or 0.47 L) of blood is   removed. The average adult has 9-12 pints (4.3-5.7 L) of blood in the body. °This is generally a safe procedure, but it can sometimes cause problems such as nausea, light-headedness, or low blood pressure  (hypotension). °This information is not intended to replace advice given to you by your health care provider. Make sure you discuss any questions you have with your health care provider. °Document Revised: 09/06/2020 Document Reviewed: 09/06/2020 °Elsevier Patient Education © 2022 [employer] ° °

## 2021-03-13 NOTE — Progress Notes (Signed)
Steve Nolan presents today for phlebotomy per MD orders. Phlebotomy procedure started at [place] and ended at 0851. 524 grams removed via phlebotomy kit Patient observed for 30 minutes after procedure without any incident. Patient tolerated procedure well. Refreshments given IV needle removed intact.

## 2021-04-02 ENCOUNTER — Ambulatory Visit (INDEPENDENT_AMBULATORY_CARE_PROVIDER_SITE_OTHER): Admitting: Family Medicine

## 2021-04-02 ENCOUNTER — Encounter: Payer: Self-pay | Admitting: Family Medicine

## 2021-04-02 VITALS — BP 114/72 | HR 75 | Temp 98.1°F | Ht 69.0 in | Wt 195.1 lb

## 2021-04-02 DIAGNOSIS — R030 Elevated blood-pressure reading, without diagnosis of hypertension: Secondary | ICD-10-CM

## 2021-04-02 NOTE — Patient Instructions (Signed)
Give [place] 2-3 business days to get the results of your labs back.   Check your blood pressures 2-3 times per week, alternating the time of day you check it. If it is high, considering waiting 1-2 minutes and rechecking. If it gets higher, your anxiety is likely creeping up and we should avoid rechecking.   I want your BP <140/90 consistently.   Let us know if you need anything.

## 2021-04-02 NOTE — Progress Notes (Signed)
Chief Complaint  Patient presents with   Hypertension    Subjective Steve Nolan is a 61 y.o. male who presents for elevated BP. He does not monitor home blood pressures. [facility] He is not on any medication to lower BP.  He is adhering to a healthy diet overall. Current exercise: walking No CP or SOB out of ordinary.   Past Medical History:  Diagnosis Date   A-fib ([place])    20 years ago,went to cardiologist no treatment needed and no follow-up - dx. torn muscle.   Chronic low back pain 11/11/2016   Kidney stones    Skin cancer     Exam BP 114/72    Pulse 75    Temp 98.1 F (36.7 C) (Oral)    Ht 5\' 9"  (1.753 m)    Wt 195 lb 2 oz (88.5 kg)    SpO2 99%    BMI 28.81 kg/m  General:  well developed, well nourished, in no apparent distress Heart: RRR, no bruits, no LE edema Lungs: clear to auscultation, no accessory muscle use Psych: well oriented with normal range of affect and appropriate judgment/insight  Elevated blood pressure reading - Plan: Basic metabolic panel  Ck renal function w hx of stones. Monitor BP at home. Goal is <140/90. Counseled on diet and exercise. F/u as originally scheduled. The patient voiced understanding and agreement to the plan.  [place], DO 04/02/21  3:04 PM

## 2021-04-03 LAB — BASIC METABOLIC PANEL
BUN: 16 mg/dL (ref 6–23)
CO2: 28 mEq/L (ref 19–32)
Calcium: 9.3 mg/dL (ref 8.4–10.5)
Chloride: 101 mEq/L (ref 96–112)
Creatinine, Ser: 0.94 mg/dL (ref 0.40–1.50)
GFR: 87.33 mL/min (ref >60.00)
Glucose, Bld: 82 mg/dL (ref 70–99)
Potassium: 4.1 mEq/L (ref 3.5–5.1)
Sodium: 136 mEq/L (ref 135–145)

## 2021-04-05 ENCOUNTER — Inpatient Hospital Stay

## 2021-04-05 ENCOUNTER — Other Ambulatory Visit: Payer: Self-pay

## 2021-04-05 ENCOUNTER — Inpatient Hospital Stay: Attending: Hematology & Oncology

## 2021-04-05 VITALS — BP 125/71 | HR 78 | Temp 97.9°F | Resp 17

## 2021-04-05 DIAGNOSIS — D751 Secondary polycythemia: Secondary | ICD-10-CM | POA: Diagnosis present

## 2021-04-05 LAB — CMP (CANCER CENTER ONLY)
ALT: 26 U/L (ref 0–44)
AST: 25 U/L (ref 15–41)
Albumin: 4.5 g/dL (ref 3.5–5.0)
Alkaline Phosphatase: 72 U/L (ref 38–126)
Anion gap: 7 (ref 5–15)
BUN: 13 mg/dL (ref 8–23)
CO2: 30 mmol/L (ref 22–32)
Calcium: 9.6 mg/dL (ref 8.9–10.3)
Chloride: 104 mmol/L (ref 98–111)
Creatinine: 0.9 mg/dL (ref 0.61–1.24)
GFR, Estimated: >60 mL/min (ref >60)
Glucose, Bld: 99 mg/dL (ref 70–99)
Potassium: 4.1 mmol/L (ref 3.5–5.1)
Sodium: 141 mmol/L (ref 135–145)
Total Bilirubin: 0.6 mg/dL (ref 0.3–1.2)
Total Protein: 6.5 g/dL (ref 6.5–8.1)

## 2021-04-05 LAB — CBC WITH DIFFERENTIAL (CANCER CENTER ONLY)
Abs Immature Granulocytes: 0.02 10*3/uL (ref 0.00–0.07)
Basophils Absolute: 0 10*3/uL (ref 0.0–0.1)
Basophils Relative: 1 %
Eosinophils Absolute: 0.1 10*3/uL (ref 0.0–0.5)
Eosinophils Relative: 2 %
HCT: 48.7 % (ref 39.0–52.0)
Hemoglobin: 16.5 g/dL (ref 13.0–17.0)
Immature Granulocytes: 0 %
Lymphocytes Relative: 46 %
Lymphs Abs: 2.9 10*3/uL (ref 0.7–4.0)
MCH: 29 pg (ref 26.0–34.0)
MCHC: 33.9 g/dL (ref 30.0–36.0)
MCV: 85.7 fL (ref 80.0–100.0)
Monocytes Absolute: 0.5 10*3/uL (ref 0.1–1.0)
Monocytes Relative: 8 %
Neutro Abs: 2.7 10*3/uL (ref 1.7–7.7)
Neutrophils Relative %: 43 %
Platelet Count: 236 10*3/uL (ref 150–400)
RBC: 5.68 MIL/uL (ref 4.22–5.81)
RDW: 12.7 % (ref 11.5–15.5)
WBC Count: 6.2 10*3/uL (ref 4.0–10.5)
nRBC: 0 % (ref 0.0–0.2)

## 2021-04-05 LAB — IRON AND IRON BINDING CAPACITY (CC-WL,HP ONLY)
Iron: 116 ug/dL (ref 45–182)
Saturation Ratios: 28 % (ref 17.9–39.5)
TIBC: 416 ug/dL (ref 250–450)
UIBC: 300 ug/dL (ref 117–376)

## 2021-04-05 LAB — FERRITIN: Ferritin: 67 ng/mL (ref 24–336)

## 2021-04-05 NOTE — Progress Notes (Signed)
Steve Nolan presents today for phlebotomy per MD orders. Phlebotomy procedure started at 0919 and ended at 0926 567 grams removed. Patient refused to be  observed for 30 minutes after procedure . VSS  Patient tolerated procedure well. IV needle removed intact.

## 2021-04-05 NOTE — Patient Instructions (Signed)
Therapeutic Phlebotomy °Therapeutic phlebotomy is the planned removal of blood from a person's body for the purpose of treating a medical condition. The procedure is lot like donating blood. Usually, about a pint (470 mL, or 0.47 L) of blood is removed. The average adult has 9-12 pints (4.3-5.7 L) of blood in his or her body. °Therapeutic phlebotomy may be used to treat the following medical conditions: °Hemochromatosis. This is a condition in which the blood contains too much iron. °Polycythemia vera. This is a condition in which the blood contains too many red blood cells. °Porphyria cutanea tarda. This is a disease in which an important part of hemoglobin is not made properly. It results in the buildup of abnormal amounts of porphyrins in the body. °Sickle cell disease. This is a condition in which the red blood cells form an abnormal crescent shape rather than a round shape. °Tell a health care provider about: °Any allergies you have. °All medicines you are taking, including vitamins, herbs, eye drops, creams, and over-the-counter medicines. °Any bleeding problems you have. °Any surgeries you have had. °Any medical conditions you have. °Whether you are pregnant or may be pregnant. °What are the risks? °Generally, this is a safe procedure. However, problems may occur, including: °Nausea or light-headedness. °Low blood pressure (hypotension). °Soreness, bleeding, swelling, or bruising at the needle insertion site. °Infection. °What happens before the procedure? °Ask your health care provider about: °Changing or stopping your regular medicines. This is especially important if you are taking diabetes medicines or blood thinners. °Taking medicines such as aspirin and ibuprofen. These medicines can thin your blood. Do not take these medicines unless your health care provider tells you to take them. °Taking over-the-counter medicines, vitamins, herbs, and supplements. °Wear clothing with sleeves that can be raised  above the elbow. °You may have a blood sample taken. °Your blood pressure, pulse rate, and breathing rate will be measured. °What happens during the procedure? ° °You may be given a medicine to numb the area (local anesthetic). °A tourniquet will be placed on your arm. °A needle will be put into one of your veins. °Tubing and a collection bag will be attached to the needle. °Blood will flow through the needle and tubing into the collection bag. °The collection bag will be placed lower than your arm so gravity can help the blood flow into the bag. °You may be asked to open and close your hand slowly and continually during the entire collection. °After the specified amount of blood has been removed from your body, the collection bag and tubing will be clamped. °The needle will be removed from your vein. °Pressure will be held on the needle site to stop the bleeding. °A bandage (dressing) will be placed over the needle insertion site. °The procedure may vary among health care providers and hospitals. °What happens after the procedure? °Your blood pressure, pulse rate, and breathing rate will be measured after the procedure. °You will be encouraged to drink fluids. °You will be encouraged to eat a snack to prevent a low blood sugar level. °Your recovery will be assessed and monitored. °Return to your normal activities as told by your health care provider. °Summary °Therapeutic phlebotomy is the planned removal of blood from a person's body for the purpose of treating a medical condition. °Therapeutic phlebotomy may be used to treat hemochromatosis, polycythemia vera, porphyria cutanea tarda, or sickle cell disease. °In the procedure, a needle is inserted and about a pint (470 mL, or 0.47 L) of blood is   removed. The average adult has 9-12 pints (4.3-5.7 L) of blood in the body. °This is generally a safe procedure, but it can sometimes cause problems such as nausea, light-headedness, or low blood pressure  (hypotension). °This information is not intended to replace advice given to you by your health care provider. Make sure you discuss any questions you have with your health care provider. °Document Revised: 09/06/2020 Document Reviewed: 09/06/2020 °Elsevier Patient Education © 2022 [employer] ° °

## 2021-05-07 ENCOUNTER — Inpatient Hospital Stay

## 2021-05-07 ENCOUNTER — Other Ambulatory Visit: Payer: Self-pay

## 2021-05-07 ENCOUNTER — Inpatient Hospital Stay: Attending: Hematology & Oncology

## 2021-05-07 DIAGNOSIS — D751 Secondary polycythemia: Secondary | ICD-10-CM | POA: Insufficient documentation

## 2021-05-07 LAB — IRON AND IRON BINDING CAPACITY (CC-WL,HP ONLY)
Iron: 55 ug/dL (ref 45–182)
Saturation Ratios: 13 % — ABNORMAL LOW (ref 17.9–39.5)
TIBC: 420 ug/dL (ref 250–450)
UIBC: 365 ug/dL (ref 117–376)

## 2021-05-07 LAB — CBC WITH DIFFERENTIAL (CANCER CENTER ONLY)
Abs Immature Granulocytes: 0.02 10*3/uL (ref 0.00–0.07)
Basophils Absolute: 0 10*3/uL (ref 0.0–0.1)
Basophils Relative: 1 %
Eosinophils Absolute: 0.1 10*3/uL (ref 0.0–0.5)
Eosinophils Relative: 3 %
HCT: 48.7 % (ref 39.0–52.0)
Hemoglobin: 16.4 g/dL (ref 13.0–17.0)
Immature Granulocytes: 0 %
Lymphocytes Relative: 44 %
Lymphs Abs: 2.4 10*3/uL (ref 0.7–4.0)
MCH: 29.2 pg (ref 26.0–34.0)
MCHC: 33.7 g/dL (ref 30.0–36.0)
MCV: 86.8 fL (ref 80.0–100.0)
Monocytes Absolute: 0.4 10*3/uL (ref 0.1–1.0)
Monocytes Relative: 7 %
Neutro Abs: 2.5 10*3/uL (ref 1.7–7.7)
Neutrophils Relative %: 45 %
Platelet Count: 236 10*3/uL (ref 150–400)
RBC: 5.61 MIL/uL (ref 4.22–5.81)
RDW: 12.2 % (ref 11.5–15.5)
WBC Count: 5.4 10*3/uL (ref 4.0–10.5)
nRBC: 0 % (ref 0.0–0.2)

## 2021-05-07 LAB — CMP (CANCER CENTER ONLY)
ALT: 25 U/L (ref 0–44)
AST: 21 U/L (ref 15–41)
Albumin: 4 g/dL (ref 3.5–5.0)
Alkaline Phosphatase: 68 U/L (ref 38–126)
Anion gap: 7 (ref 5–15)
BUN: 10 mg/dL (ref 8–23)
CO2: 28 mmol/L (ref 22–32)
Calcium: 8.9 mg/dL (ref 8.9–10.3)
Chloride: 104 mmol/L (ref 98–111)
Creatinine: 0.92 mg/dL (ref 0.61–1.24)
GFR, Estimated: >60 mL/min (ref >60)
Glucose, Bld: 111 mg/dL — ABNORMAL HIGH (ref 70–99)
Potassium: 4.3 mmol/L (ref 3.5–5.1)
Sodium: 139 mmol/L (ref 135–145)
Total Bilirubin: 0.3 mg/dL (ref 0.3–1.2)
Total Protein: 6.6 g/dL (ref 6.5–8.1)

## 2021-05-07 LAB — FERRITIN: Ferritin: 35 ng/mL (ref 24–336)

## 2021-05-07 NOTE — Patient Instructions (Signed)

## 2021-05-07 NOTE — Progress Notes (Signed)
Steve Nolan presents today for phlebotomy per MD orders. Phlebotomy procedure started at 9:23 AM and ended at 9:31 AM 550 grams removed via 16 gauge needle to right AC.  Patient declined observed for 30 minutes after procedure. Snacks and fluids offered. Patient tolerated procedure well and received and declined replacement fluids after procedure. Patient understands to call if he has any questions or concerns post discharge.

## 2021-06-04 ENCOUNTER — Inpatient Hospital Stay (HOSPITAL_BASED_OUTPATIENT_CLINIC_OR_DEPARTMENT_OTHER): Admitting: Family

## 2021-06-04 ENCOUNTER — Other Ambulatory Visit: Payer: Self-pay

## 2021-06-04 ENCOUNTER — Inpatient Hospital Stay: Attending: Hematology & Oncology

## 2021-06-04 ENCOUNTER — Inpatient Hospital Stay

## 2021-06-04 ENCOUNTER — Encounter: Payer: Self-pay | Admitting: Family

## 2021-06-04 VITALS — BP 132/71 | HR 73 | Temp 98.1°F | Resp 17 | Wt 196.4 lb

## 2021-06-04 DIAGNOSIS — D751 Secondary polycythemia: Secondary | ICD-10-CM

## 2021-06-04 DIAGNOSIS — D500 Iron deficiency anemia secondary to blood loss (chronic): Secondary | ICD-10-CM | POA: Diagnosis not present

## 2021-06-04 LAB — CBC WITH DIFFERENTIAL (CANCER CENTER ONLY)
Abs Immature Granulocytes: 0.02 10*3/uL (ref 0.00–0.07)
Basophils Absolute: 0 10*3/uL (ref 0.0–0.1)
Basophils Relative: 1 %
Eosinophils Absolute: 0.3 10*3/uL (ref 0.0–0.5)
Eosinophils Relative: 4 %
HCT: 47.7 % (ref 39.0–52.0)
Hemoglobin: 16.1 g/dL (ref 13.0–17.0)
Immature Granulocytes: 0 %
Lymphocytes Relative: 37 %
Lymphs Abs: 3.1 10*3/uL (ref 0.7–4.0)
MCH: 28.8 pg (ref 26.0–34.0)
MCHC: 33.8 g/dL (ref 30.0–36.0)
MCV: 85.3 fL (ref 80.0–100.0)
Monocytes Absolute: 0.6 10*3/uL (ref 0.1–1.0)
Monocytes Relative: 7 %
Neutro Abs: 4.3 10*3/uL (ref 1.7–7.7)
Neutrophils Relative %: 51 %
Platelet Count: 225 10*3/uL (ref 150–400)
RBC: 5.59 MIL/uL (ref 4.22–5.81)
RDW: 11.7 % (ref 11.5–15.5)
WBC Count: 8.3 10*3/uL (ref 4.0–10.5)
nRBC: 0 % (ref 0.0–0.2)

## 2021-06-04 LAB — CMP (CANCER CENTER ONLY)
ALT: 27 U/L (ref 0–44)
AST: 26 U/L (ref 15–41)
Albumin: 4 g/dL (ref 3.5–5.0)
Alkaline Phosphatase: 72 U/L (ref 38–126)
Anion gap: 9 (ref 5–15)
BUN: 13 mg/dL (ref 8–23)
CO2: 27 mmol/L (ref 22–32)
Calcium: 8.9 mg/dL (ref 8.9–10.3)
Chloride: 103 mmol/L (ref 98–111)
Creatinine: 1.02 mg/dL (ref 0.61–1.24)
GFR, Estimated: >60 mL/min (ref >60)
Glucose, Bld: 135 mg/dL — ABNORMAL HIGH (ref 70–99)
Potassium: 3.9 mmol/L (ref 3.5–5.1)
Sodium: 139 mmol/L (ref 135–145)
Total Bilirubin: 0.5 mg/dL (ref 0.3–1.2)
Total Protein: 7.2 g/dL (ref 6.5–8.1)

## 2021-06-04 LAB — IRON AND IRON BINDING CAPACITY (CC-WL,HP ONLY)
Iron: 61 ug/dL (ref 45–182)
Saturation Ratios: 13 % — ABNORMAL LOW (ref 17.9–39.5)
TIBC: 455 ug/dL — ABNORMAL HIGH (ref 250–450)
UIBC: 394 ug/dL — ABNORMAL HIGH (ref 117–376)

## 2021-06-04 LAB — FERRITIN: Ferritin: 17 ng/mL — ABNORMAL LOW (ref 24–336)

## 2021-06-04 NOTE — Progress Notes (Signed)
Steve Nolan presents today for phlebotomy per MD orders. ?Phlebotomy procedure started at 0835 and ended at 0845 via phlebotomy kit to left ac. ?550 grams removed. ?Patient refused to stay for 30 minutes post phlebotomy and refused snack or drink. Pt without complaints at time of discharge.  ?Patient tolerated procedure well. ?IV needle removed intact. ? ? ?

## 2021-06-04 NOTE — Progress Notes (Signed)
?Hematology and Oncology Follow Up Visit ? ?Steve Nolan ?[id number] ?05/16/1960 61 y.o. ?06/04/2021 ? ? 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  ?Current Therapy:        ?Phlebotomy to maintain Hct < 45% ?  ?Interim History:  Steve Nolan is here today for follow-up and phlebotomy.  ?Hct is 47.7. ?He is doing well but still notes some fatigue at times as well as mild SOB with exertion and aches in the bottoms of his feet.  ?No fever, chills, n/v, cough, rash, dizziness, chest pain, palpitations, abdominal pain or changes in bowel or bladder habits at this time.   ?No blood loss noted. No petechiae.  ?No swelling, numbness or tingling in his extremities.  ?No falls or syncope to report.  ?He has maintained a good appetite and is staying well hydrated. His weight is stable at 196 lbs.  ? ?ECOG Performance Status: 1 - Symptomatic but completely ambulatory ? ?Medications:  ?Allergies as of 06/04/2021   ? ?   Reactions  ? Sulfa Antibiotics Rash  ? ?  ? ?  ?Medication List  ?  ? ?  ? Accurate as of June 04, 2021  8:45 AM. If you have any questions, ask your nurse or doctor.  ?  ?  ? ?  ? ?diclofenac Sodium 1 % Gel ?Commonly known as: VOLTAREN ?Apply 4 g topically 4 (four) times daily. To affected joint. ?  ?tadalafil 5 MG tablet ?Commonly known as: CIALIS ?Take 1 tablet (5 mg total) by mouth daily. ?  ?VITAMIN B 12 PO ?Place 1 tablet under the tongue once a week. ?  ? ?  ? ? ?Allergies:  ?Allergies  ?Allergen Reactions  ? Sulfa Antibiotics Rash  ? ? ?Past Medical History, Surgical history, Social history, and Family History were reviewed and updated. ? ?Review of Systems: ?All other 10 point review of systems is negative.  ? ?Physical Exam: ? weight is 196 lb 6.4 oz (89.1 kg). His oral temperature is 98.1 ?F (36.7 ?C). His blood pressure is 132/71 and his pulse is 73. His respiration is 17 and  oxygen saturation is 97%.  ? ?Wt Readings from Last 3 Encounters:  ?06/04/21 196 lb 6.4 oz (89.1 kg)  ?04/02/21 195 lb 2 oz (88.5 kg)  ?03/05/21 193 lb 12.8 oz (87.9 kg)  ? ? ?Ocular: Sclerae unicteric, pupils equal, round and reactive to light ?Ear-nose-throat: Oropharynx clear, dentition fair ?Lymphatic: No cervical or supraclavicular adenopathy ?Lungs no rales or rhonchi, good excursion bilaterally ?Heart regular rate and rhythm, no murmur appreciated ?Abd soft, nontender, positive bowel sounds ?MSK no focal spinal tenderness, no joint edema ?Neuro: non-focal, well-oriented, appropriate affect ?Breasts: Deferred  ? ?Lab Results  ?Component Value Date  ? WBC 8.3 06/04/2021  ? HGB 16.1 06/04/2021  ? HCT 47.7 06/04/2021  ? MCV 85.3 06/04/2021  ? PLT 225 06/04/2021  ? ?Lab Results  ?Component Value Date  ? FERRITIN 35 05/07/2021  ? IRON 55 05/07/2021  ? TIBC 420 05/07/2021  ? UIBC 365 05/07/2021  ? IRONPCTSAT 13 (L) 05/07/2021  ? ?Lab Results  ?Component Value Date  ? RETICCTPCT 2.4 12/04/2020  ? RBC 5.59 06/04/2021  ? ?No results found for: KPAFRELGTCHN, LAMBDASER, KAPLAMBRATIO ?No results found for: IGGSERUM, IGA, IGMSERUM ?No results found for: TOTALPROTELP, ALBUMINELP, A1GS, A2GS, BETS, BETA2SER, GAMS, MSPIKE, SPEI ?  Chemistry   ?   ?  Component Value Date/Time  ? NA 139 05/07/2021 0905  ? K 4.3 05/07/2021 0905  ? CL 104 05/07/2021 0905  ? CO2 28 05/07/2021 0905  ? BUN 10 05/07/2021 0905  ? CREATININE 0.92 05/07/2021 0905  ?    ?Component Value Date/Time  ? CALCIUM 8.9 05/07/2021 0905  ? ALKPHOS 68 05/07/2021 0905  ? AST 21 05/07/2021 0905  ? ALT 25 05/07/2021 0905  ? BILITOT 0.3 05/07/2021 0905  ?  ? ? ? ?Impression and Plan: Steve Nolan is a very pleasant 61 yo gentleman with intermittent mild erythrocytosis. 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  ?Hemochromatosis DNA testing was negative.  ?Phlebotomy today, Hct  47.7%.  ?Lab check monthly and phlebotomy and follow-up in 4 months.  ? ?Steve Dawson, NP ?3/13/20238:45 AM ? ?

## 2021-06-07 ENCOUNTER — Encounter: Payer: Self-pay | Admitting: Family

## 2021-07-05 ENCOUNTER — Inpatient Hospital Stay: Attending: Hematology & Oncology

## 2021-07-05 DIAGNOSIS — D751 Secondary polycythemia: Secondary | ICD-10-CM | POA: Insufficient documentation

## 2021-07-05 DIAGNOSIS — D500 Iron deficiency anemia secondary to blood loss (chronic): Secondary | ICD-10-CM

## 2021-07-05 LAB — CMP (CANCER CENTER ONLY)
ALT: 29 U/L (ref 0–44)
AST: 30 U/L (ref 15–41)
Albumin: 4.1 g/dL (ref 3.5–5.0)
Alkaline Phosphatase: 75 U/L (ref 38–126)
Anion gap: 7 (ref 5–15)
BUN: 12 mg/dL (ref 8–23)
CO2: 30 mmol/L (ref 22–32)
Calcium: 9.3 mg/dL (ref 8.9–10.3)
Chloride: 104 mmol/L (ref 98–111)
Creatinine: 0.9 mg/dL (ref 0.61–1.24)
GFR, Estimated: >60 mL/min (ref >60)
Glucose, Bld: 112 mg/dL — ABNORMAL HIGH (ref 70–99)
Potassium: 3.6 mmol/L (ref 3.5–5.1)
Sodium: 141 mmol/L (ref 135–145)
Total Bilirubin: 0.4 mg/dL (ref 0.3–1.2)
Total Protein: 6.2 g/dL — ABNORMAL LOW (ref 6.5–8.1)

## 2021-07-05 LAB — CBC WITH DIFFERENTIAL (CANCER CENTER ONLY)
Abs Immature Granulocytes: 0.01 10*3/uL (ref 0.00–0.07)
Basophils Absolute: 0 10*3/uL (ref 0.0–0.1)
Basophils Relative: 0 %
Eosinophils Absolute: 0.1 10*3/uL (ref 0.0–0.5)
Eosinophils Relative: 2 %
HCT: 44.1 % (ref 39.0–52.0)
Hemoglobin: 14.3 g/dL (ref 13.0–17.0)
Immature Granulocytes: 0 %
Lymphocytes Relative: 35 %
Lymphs Abs: 2.2 10*3/uL (ref 0.7–4.0)
MCH: 27.3 pg (ref 26.0–34.0)
MCHC: 32.4 g/dL (ref 30.0–36.0)
MCV: 84.3 fL (ref 80.0–100.0)
Monocytes Absolute: 0.6 10*3/uL (ref 0.1–1.0)
Monocytes Relative: 9 %
Neutro Abs: 3.4 10*3/uL (ref 1.7–7.7)
Neutrophils Relative %: 54 %
Platelet Count: 245 10*3/uL (ref 150–400)
RBC: 5.23 MIL/uL (ref 4.22–5.81)
RDW: 11.9 % (ref 11.5–15.5)
WBC Count: 6.4 10*3/uL (ref 4.0–10.5)
nRBC: 0 % (ref 0.0–0.2)

## 2021-07-05 LAB — IRON AND IRON BINDING CAPACITY (CC-WL,HP ONLY)
Iron: 87 ug/dL (ref 45–182)
Saturation Ratios: 20 % (ref 17.9–39.5)
TIBC: 433 ug/dL (ref 250–450)
UIBC: 346 ug/dL (ref 117–376)

## 2021-07-05 LAB — FERRITIN: Ferritin: 14 ng/mL — ABNORMAL LOW (ref 24–336)

## 2021-08-03 ENCOUNTER — Inpatient Hospital Stay: Attending: Hematology & Oncology

## 2021-08-03 DIAGNOSIS — D500 Iron deficiency anemia secondary to blood loss (chronic): Secondary | ICD-10-CM

## 2021-08-03 DIAGNOSIS — D751 Secondary polycythemia: Secondary | ICD-10-CM | POA: Insufficient documentation

## 2021-08-03 LAB — CMP (CANCER CENTER ONLY)
ALT: 32 U/L (ref 0–44)
AST: 29 U/L (ref 15–41)
Albumin: 4.5 g/dL (ref 3.5–5.0)
Alkaline Phosphatase: 73 U/L (ref 38–126)
Anion gap: 7 (ref 5–15)
BUN: 16 mg/dL (ref 8–23)
CO2: 28 mmol/L (ref 22–32)
Calcium: 9.6 mg/dL (ref 8.9–10.3)
Chloride: 104 mmol/L (ref 98–111)
Creatinine: 0.95 mg/dL (ref 0.61–1.24)
GFR, Estimated: >60 mL/min (ref >60)
Glucose, Bld: 101 mg/dL — ABNORMAL HIGH (ref 70–99)
Potassium: 4.1 mmol/L (ref 3.5–5.1)
Sodium: 139 mmol/L (ref 135–145)
Total Bilirubin: 0.4 mg/dL (ref 0.3–1.2)
Total Protein: 6.7 g/dL (ref 6.5–8.1)

## 2021-08-03 LAB — CBC WITH DIFFERENTIAL (CANCER CENTER ONLY)
Abs Immature Granulocytes: 0 10*3/uL (ref 0.00–0.07)
Basophils Absolute: 0 10*3/uL (ref 0.0–0.1)
Basophils Relative: 0 %
Eosinophils Absolute: 0.1 10*3/uL (ref 0.0–0.5)
Eosinophils Relative: 2 %
HCT: 47.1 % (ref 39.0–52.0)
Hemoglobin: 15.3 g/dL (ref 13.0–17.0)
Immature Granulocytes: 0 %
Lymphocytes Relative: 44 %
Lymphs Abs: 2.5 10*3/uL (ref 0.7–4.0)
MCH: 27.3 pg (ref 26.0–34.0)
MCHC: 32.5 g/dL (ref 30.0–36.0)
MCV: 84 fL (ref 80.0–100.0)
Monocytes Absolute: 0.4 10*3/uL (ref 0.1–1.0)
Monocytes Relative: 7 %
Neutro Abs: 2.6 10*3/uL (ref 1.7–7.7)
Neutrophils Relative %: 47 %
Platelet Count: 244 10*3/uL (ref 150–400)
RBC: 5.61 MIL/uL (ref 4.22–5.81)
RDW: 12.8 % (ref 11.5–15.5)
WBC Count: 5.6 10*3/uL (ref 4.0–10.5)
nRBC: 0 % (ref 0.0–0.2)

## 2021-08-03 LAB — IRON AND IRON BINDING CAPACITY (CC-WL,HP ONLY)
Iron: 74 ug/dL (ref 45–182)
Saturation Ratios: 18 % (ref 17.9–39.5)
TIBC: 417 ug/dL (ref 250–450)
UIBC: 343 ug/dL (ref 117–376)

## 2021-08-03 LAB — FERRITIN: Ferritin: 19 ng/mL — ABNORMAL LOW (ref 24–336)

## 2021-08-22 ENCOUNTER — Encounter: Payer: Self-pay | Admitting: Family Medicine

## 2021-08-22 ENCOUNTER — Other Ambulatory Visit: Payer: Self-pay | Admitting: Family Medicine

## 2021-08-22 DIAGNOSIS — Z125 Encounter for screening for malignant neoplasm of prostate: Secondary | ICD-10-CM

## 2021-08-22 DIAGNOSIS — Z0000 Encounter for general adult medical examination without abnormal findings: Secondary | ICD-10-CM

## 2021-08-27 ENCOUNTER — Other Ambulatory Visit (INDEPENDENT_AMBULATORY_CARE_PROVIDER_SITE_OTHER)

## 2021-08-27 ENCOUNTER — Other Ambulatory Visit: Payer: Self-pay | Admitting: Family Medicine

## 2021-08-27 DIAGNOSIS — E7800 Pure hypercholesterolemia, unspecified: Secondary | ICD-10-CM

## 2021-08-27 DIAGNOSIS — Z0000 Encounter for general adult medical examination without abnormal findings: Secondary | ICD-10-CM | POA: Diagnosis not present

## 2021-08-27 DIAGNOSIS — Z125 Encounter for screening for malignant neoplasm of prostate: Secondary | ICD-10-CM | POA: Diagnosis not present

## 2021-08-27 DIAGNOSIS — R972 Elevated prostate specific antigen [PSA]: Secondary | ICD-10-CM

## 2021-08-27 LAB — URINALYSIS, ROUTINE W REFLEX MICROSCOPIC
Bilirubin Urine: NEGATIVE
Hgb urine dipstick: NEGATIVE
Ketones, ur: NEGATIVE
Leukocytes,Ua: NEGATIVE
Nitrite: NEGATIVE
RBC / HPF: NONE SEEN (ref 0–?)
Specific Gravity, Urine: 1.02 (ref 1.000–1.030)
Total Protein, Urine: NEGATIVE
Urine Glucose: NEGATIVE
Urobilinogen, UA: 0.2 (ref 0.0–1.0)
pH: 6 (ref 5.0–8.0)

## 2021-08-27 LAB — COMPREHENSIVE METABOLIC PANEL
ALT: 18 U/L (ref 0–53)
AST: 20 U/L (ref 0–37)
Albumin: 4.2 g/dL (ref 3.5–5.2)
Alkaline Phosphatase: 75 U/L (ref 39–117)
BUN: 11 mg/dL (ref 6–23)
CO2: 29 mEq/L (ref 19–32)
Calcium: 9 mg/dL (ref 8.4–10.5)
Chloride: 102 mEq/L (ref 96–112)
Creatinine, Ser: 0.86 mg/dL (ref 0.40–1.50)
GFR: 93.02 mL/min (ref >60.00)
Glucose, Bld: 86 mg/dL (ref 70–99)
Potassium: 4.1 mEq/L (ref 3.5–5.1)
Sodium: 138 mEq/L (ref 135–145)
Total Bilirubin: 0.7 mg/dL (ref 0.2–1.2)
Total Protein: 6.7 g/dL (ref 6.0–8.3)

## 2021-08-27 LAB — LIPID PANEL
Cholesterol: 164 mg/dL (ref 0–200)
HDL: 41.2 mg/dL (ref >39.00)
LDL Cholesterol: 85 mg/dL (ref 0–99)
NonHDL: 122.5
Total CHOL/HDL Ratio: 4
Triglycerides: 188 mg/dL — ABNORMAL HIGH (ref 0.0–149.0)
VLDL: 37.6 mg/dL (ref 0.0–40.0)

## 2021-08-27 LAB — PSA: PSA: 1.65 ng/mL (ref 0.10–4.00)

## 2021-08-27 LAB — CBC
HCT: 46.4 % (ref 39.0–52.0)
Hemoglobin: 15.1 g/dL (ref 13.0–17.0)
MCHC: 32.4 g/dL (ref 30.0–36.0)
MCV: 82.6 fl (ref 78.0–100.0)
Platelets: 222 10*3/uL (ref 150.0–400.0)
RBC: 5.62 Mil/uL (ref 4.22–5.81)
RDW: 15 % (ref 11.5–15.5)
WBC: 7.9 10*3/uL (ref 4.0–10.5)

## 2021-08-29 ENCOUNTER — Encounter: Payer: Self-pay | Admitting: Family Medicine

## 2021-08-29 ENCOUNTER — Ambulatory Visit (INDEPENDENT_AMBULATORY_CARE_PROVIDER_SITE_OTHER): Admitting: Family Medicine

## 2021-08-29 VITALS — BP 108/78 | HR 71 | Temp 97.6°F | Ht 69.0 in | Wt 184.2 lb

## 2021-08-29 DIAGNOSIS — Z0000 Encounter for general adult medical examination without abnormal findings: Secondary | ICD-10-CM | POA: Diagnosis not present

## 2021-08-29 DIAGNOSIS — M722 Plantar fascial fibromatosis: Secondary | ICD-10-CM

## 2021-08-29 MED ORDER — TADALAFIL 5 MG PO TABS
5.0000 mg | ORAL_TABLET | Freq: Every day | ORAL | 3 refills | Status: DC
Start: 2021-08-29 — End: 2021-09-26

## 2021-08-29 NOTE — Patient Instructions (Addendum)
You can take up to 4 tabs of Cialis at once.  Keep the diet clean and stay active.  Please get me a copy of your advanced directive form at your convenience.   Consider a Strassburg sock.   Consider Powerstep insoles. There are very quality over the counter inserts. Shop around online and in stores. Dr. Felicie Morn is a cheaper alternative, though is not as high of quality.   OK to take Tylenol 1000 mg (2 extra strength tabs) or 975 mg (3 regular strength tabs) every 6 hours as needed.  Let us know if you need anything.  Plantar Fasciitis Stretches/exercises Do exercises exactly as told by your health care provider and adjust them as directed. It is normal to feel mild stretching, pulling, tightness, or discomfort as you do these exercises, but you should stop right away if you feel sudden pain or your pain gets worse.   Stretching and range of motion exercises These exercises warm up your muscles and joints and improve the movement and flexibility of your foot. These exercises also help to relieve pain.  Exercise A: Plantar fascia stretch Sit with your left / right leg crossed over your opposite knee. Hold your heel with one hand with that thumb near your arch. With your other hand, hold your toes and gently pull them back toward the top of your foot. You should feel a stretch on the bottom of your toes or your foot or both. Hold this stretch for 30 seconds. Slowly release your toes and return to the starting position. Repeat 2 times. Complete this exercise 3 times per week.  Exercise B: Gastroc, standing Stand with your hands against a wall. Extend your left / right leg behind you, and bend your front knee slightly. Keeping your heels on the floor and keeping your back knee straight, shift your weight toward the wall without arching your back. You should feel a gentle stretch in your left / right calf. Hold this position for 30 seconds. Repeat 2 times. Complete this exercise 3 times a  week. Exercise C: Soleus, standing Stand with your hands against a wall. Extend your left / right leg behind you, and bend your front knee slightly. Keeping your heels on the floor, bend your back knee and slightly shift your weight over the back leg. You should feel a gentle stretch deep in your calf. Hold this position for 30 seconds. Repeat 2 times. Complete this exercise 3 times per week. Exercise D: Gastrocsoleus, standing Stand with the ball of your left / right foot on a step. The ball of your foot is on the walking surface, right under your toes. Keep your other foot firmly on the same step. Hold onto the wall or a railing for balance. Slowly lift your other foot, allowing your body weight to press your heel down over the edge of the step. You should feel a stretch in your left / right calf. Hold this position for 30 seconds. Return both feet to the step. Repeat this exercise with a slight bend in your left / right knee. Repeat 2 times with your left / right knee straight and 2times with your left / right knee bent. Complete this exercise 3 times a week.  Balance exercise This exercise builds your balance and strength control of your arch to help take pressure off your plantar fascia. Exercise E: Single leg stand Without shoes, stand near a railing or in a doorway. You may hold onto the railing or door frame  as needed. Stand on your left / right foot. Keep your big toe down on the floor and try to keep your arch lifted. Do not let your foot roll inward. Hold this position for 30 seconds. If this exercise is too easy, you can try it with your eyes closed or while standing on a pillow. Repeat 2 times. Complete this exercise 3 times per week. This information is not intended to replace advice given to you by your health care provider. Make sure you discuss any questions you have with your health care provider. Document Released: 03/11/2005 Document Revised: 11/14/2015 Document  Reviewed: 01/23/2015 Elsevier Interactive Patient Education  2017 [employer].

## 2021-08-29 NOTE — Progress Notes (Addendum)
Chief Complaint  Patient presents with   Annual Exam    Foot pain PRINTED cialis Refills today    Well Male Steve Nolan is here for a complete physical.   His last physical was >1 year ago.  Current diet: in general, a "healthy" diet.  Current exercise: running, lifting wts Weight trend: down a few lbs Fatigue out of ordinary? No. Seat belt? Yes.   Advanced directive? Yes  Health maintenance Shingrix- Yes Colonoscopy- Yes Tetanus- Yes HIV- Yes Hep C- Yes  L foot pain Started during training for a 5 k and started having pain in L heel. No other inj. Denies bruising, redness, swelling, neuro s/s's. Tried ice, heat, E stim, stretches.    Past Medical History:  Diagnosis Date   A-fib ([place])    20 years ago,went to cardiologist no treatment needed and no follow-up - dx. torn muscle.   Chronic low back pain 11/11/2016   Kidney stones    Skin cancer     Past Surgical History:  Procedure Laterality Date   COLONOSCOPY     around 2011. Dr Otho Darner. [facility]. Michela Pitcher he was due in 2021.    ESOPHAGOGASTRODUODENOSCOPY     Around 2011   EXTRACORPOREAL SHOCK WAVE LITHOTRIPSY Left 01/09/2017   Procedure: LEFT EXTRACORPOREAL SHOCK WAVE LITHOTRIPSY (ESWL);  Surgeon: Cleon Gustin, MD;  Location: [facility];  Service: Urology;  Laterality: Left;   FUNCTIONAL ENDOSCOPIC SINUS SURGERY Bilateral 1998   deviated septum   left shoulder surgery Left    arthoscopic    Medications  Current Outpatient Medications on File Prior to Visit  Medication Sig Dispense Refill   Cyanocobalamin (VITAMIN B 12 PO) Place 1 tablet under the tongue once a week.      tadalafil (CIALIS) 5 MG tablet Take 1 tablet (5 mg total) by mouth daily. 90 tablet 3    Allergies Allergies  Allergen Reactions   Sulfa Antibiotics Rash    Family History Family History  Problem Relation Age of Onset   Hypertension Mother    Diabetes Mother    Non-Hodgkin's lymphoma Mother    Heart disease Father     Skin cancer Father    Lung cancer Sister    Non-Hodgkin's lymphoma Sister    Stroke Maternal Grandmother    Hypertension Maternal Grandmother    Drug abuse Maternal Grandmother    Heart disease Maternal Grandfather    Hypertension Maternal Grandfather    Heart disease Paternal Grandfather    Colon cancer Neg Hx    Esophageal cancer Neg Hx    Prostate cancer Neg Hx     Review of Systems: Constitutional:  no fevers Eye:  no recent significant change in vision Ear/Nose/Mouth/Throat:  Ears:  no hearing loss Nose/Mouth/Throat:  no complaints of nasal congestion, no sore throat Cardiovascular:  no chest pain Respiratory:  no shortness of breath Gastrointestinal:  no change in bowel habits GU:  Male: negative for dysuria, frequency Musculoskeletal/Extremities:  +L heel pain Integumentary (Skin/Breast):  no abnormal skin lesions reported Neurologic:  no headaches Endocrine: No unexpected weight changes Hematologic/Lymphatic:  no abnormal bleeding  Exam BP 108/78   Pulse 71   Temp 97.6 F (36.4 C) (Oral)   Ht '5\' 9"'$  (1.753 m)   Wt 184 lb 4 oz (83.6 kg)   SpO2 97%   BMI 27.21 kg/m  General:  well developed, well nourished, in no apparent distress Skin:  no significant moles, warts, or growths Head:  no masses,  lesions, or tenderness Eyes:  pupils equal and round, sclera anicteric without injection Ears:  canals without lesions, TMs shiny without retraction, no obvious effusion, no erythema Nose:  nares patent, septum midline, mucosa normal Throat/Pharynx:  lips and gingiva without lesion; tongue and uvula midline; non-inflamed pharynx; no exudates or postnasal drainage Neck: neck supple without adenopathy, thyromegaly, or masses Cardiac: RRR, no bruits, no LE edema Lungs:  clear to auscultation, breath sounds equal bilaterally, no respiratory distress Abdomen: BS+, soft, non-tender, non-distended, no masses or organomegaly noted Rectal: Deferred Musculoskeletal: +TTP over prox  insertion of PF on L foot, no deformity, edema, ecchymosis, ttp over Achilles Neuro:  gait normal; deep tendon reflexes normal and symmetric Psych: well oriented with normal range of affect and appropriate judgment/insight  Assessment and Plan  Well adult exam  Plantar fasciitis   Well 61 y.o. male. Counseled on diet and exercise. Advanced directive form requested today.  PF: Tylenol, ice, stretches/exercises, arch support (Powerstep, Dr. Felicie Morn), Aletta Edouard sock. PT if no better ina few weeks. Discussed injections, would rec holding on this until trying above plan first or perhaps after PT.  Immunizations, labs, and further orders as above. Follow up in 1 yr or prn. The patient voiced understanding and agreement to the plan.  [place], DO 08/29/21 9:26 AM

## 2021-08-31 ENCOUNTER — Telehealth: Payer: Self-pay | Admitting: *Deleted

## 2021-08-31 NOTE — Telephone Encounter (Signed)
Received documents from [facility] [place] for medical records - sent documents to [facility]- [facility]

## 2021-09-04 ENCOUNTER — Inpatient Hospital Stay: Attending: Hematology & Oncology

## 2021-09-04 ENCOUNTER — Ambulatory Visit: Admitting: Family

## 2021-09-04 DIAGNOSIS — D751 Secondary polycythemia: Secondary | ICD-10-CM | POA: Insufficient documentation

## 2021-09-05 ENCOUNTER — Other Ambulatory Visit: Payer: Self-pay | Admitting: Oncology

## 2021-09-05 ENCOUNTER — Inpatient Hospital Stay

## 2021-09-05 DIAGNOSIS — D500 Iron deficiency anemia secondary to blood loss (chronic): Secondary | ICD-10-CM

## 2021-09-05 DIAGNOSIS — D751 Secondary polycythemia: Secondary | ICD-10-CM | POA: Diagnosis present

## 2021-09-05 LAB — CBC WITH DIFFERENTIAL (CANCER CENTER ONLY)
Abs Immature Granulocytes: 0.01 10*3/uL (ref 0.00–0.07)
Basophils Absolute: 0 10*3/uL (ref 0.0–0.1)
Basophils Relative: 1 %
Eosinophils Absolute: 0.1 10*3/uL (ref 0.0–0.5)
Eosinophils Relative: 1 %
HCT: 47.9 % (ref 39.0–52.0)
Hemoglobin: 15.8 g/dL (ref 13.0–17.0)
Immature Granulocytes: 0 %
Lymphocytes Relative: 41 %
Lymphs Abs: 2.5 10*3/uL (ref 0.7–4.0)
MCH: 27.3 pg (ref 26.0–34.0)
MCHC: 33 g/dL (ref 30.0–36.0)
MCV: 82.7 fL (ref 80.0–100.0)
Monocytes Absolute: 0.5 10*3/uL (ref 0.1–1.0)
Monocytes Relative: 8 %
Neutro Abs: 3 10*3/uL (ref 1.7–7.7)
Neutrophils Relative %: 49 %
Platelet Count: 231 10*3/uL (ref 150–400)
RBC: 5.79 MIL/uL (ref 4.22–5.81)
RDW: 14 % (ref 11.5–15.5)
WBC Count: 6 10*3/uL (ref 4.0–10.5)
nRBC: 0 % (ref 0.0–0.2)

## 2021-09-05 LAB — CMP (CANCER CENTER ONLY)
ALT: 20 U/L (ref 0–44)
AST: 26 U/L (ref 15–41)
Albumin: 4.4 g/dL (ref 3.5–5.0)
Alkaline Phosphatase: 66 U/L (ref 38–126)
Anion gap: 7 (ref 5–15)
BUN: 15 mg/dL (ref 8–23)
CO2: 27 mmol/L (ref 22–32)
Calcium: 9.4 mg/dL (ref 8.9–10.3)
Chloride: 104 mmol/L (ref 98–111)
Creatinine: 0.86 mg/dL (ref 0.61–1.24)
GFR, Estimated: >60 mL/min (ref >60)
Glucose, Bld: 112 mg/dL — ABNORMAL HIGH (ref 70–99)
Potassium: 4.1 mmol/L (ref 3.5–5.1)
Sodium: 138 mmol/L (ref 135–145)
Total Bilirubin: 0.6 mg/dL (ref 0.3–1.2)
Total Protein: 6.5 g/dL (ref 6.5–8.1)

## 2021-09-05 LAB — IRON AND IRON BINDING CAPACITY (CC-WL,HP ONLY)
Iron: 181 ug/dL (ref 45–182)
Saturation Ratios: 43 % — ABNORMAL HIGH (ref 17.9–39.5)
TIBC: 424 ug/dL (ref 250–450)
UIBC: 243 ug/dL (ref 117–376)

## 2021-09-05 LAB — FERRITIN: Ferritin: 30 ng/mL (ref 24–336)

## 2021-09-13 ENCOUNTER — Encounter: Payer: Self-pay | Admitting: Family

## 2021-09-14 ENCOUNTER — Telehealth: Payer: Self-pay | Admitting: *Deleted

## 2021-09-17 ENCOUNTER — Inpatient Hospital Stay

## 2021-09-17 ENCOUNTER — Other Ambulatory Visit: Payer: Self-pay | Admitting: *Deleted

## 2021-09-17 VITALS — BP 113/71 | HR 67 | Resp 18

## 2021-09-17 DIAGNOSIS — D751 Secondary polycythemia: Secondary | ICD-10-CM

## 2021-09-17 DIAGNOSIS — D500 Iron deficiency anemia secondary to blood loss (chronic): Secondary | ICD-10-CM

## 2021-09-17 LAB — IRON AND IRON BINDING CAPACITY (CC-WL,HP ONLY)
Iron: 99 ug/dL (ref 45–182)
Saturation Ratios: 25 % (ref 17.9–39.5)
TIBC: 403 ug/dL (ref 250–450)
UIBC: 304 ug/dL (ref 117–376)

## 2021-09-17 LAB — CBC WITH DIFFERENTIAL (CANCER CENTER ONLY)
Abs Immature Granulocytes: 0.01 10*3/uL (ref 0.00–0.07)
Basophils Absolute: 0 10*3/uL (ref 0.0–0.1)
Basophils Relative: 1 %
Eosinophils Absolute: 0.2 10*3/uL (ref 0.0–0.5)
Eosinophils Relative: 3 %
HCT: 47.9 % (ref 39.0–52.0)
Hemoglobin: 16 g/dL (ref 13.0–17.0)
Immature Granulocytes: 0 %
Lymphocytes Relative: 43 %
Lymphs Abs: 2.4 10*3/uL (ref 0.7–4.0)
MCH: 27.5 pg (ref 26.0–34.0)
MCHC: 33.4 g/dL (ref 30.0–36.0)
MCV: 82.3 fL (ref 80.0–100.0)
Monocytes Absolute: 0.4 10*3/uL (ref 0.1–1.0)
Monocytes Relative: 8 %
Neutro Abs: 2.5 10*3/uL (ref 1.7–7.7)
Neutrophils Relative %: 45 %
Platelet Count: 228 10*3/uL (ref 150–400)
RBC: 5.82 MIL/uL — ABNORMAL HIGH (ref 4.22–5.81)
RDW: 13.7 % (ref 11.5–15.5)
WBC Count: 5.5 10*3/uL (ref 4.0–10.5)
nRBC: 0 % (ref 0.0–0.2)

## 2021-09-17 LAB — CMP (CANCER CENTER ONLY)
ALT: 15 U/L (ref 0–44)
AST: 17 U/L (ref 15–41)
Albumin: 4.3 g/dL (ref 3.5–5.0)
Alkaline Phosphatase: 63 U/L (ref 38–126)
Anion gap: 6 (ref 5–15)
BUN: 14 mg/dL (ref 8–23)
CO2: 30 mmol/L (ref 22–32)
Calcium: 9.4 mg/dL (ref 8.9–10.3)
Chloride: 104 mmol/L (ref 98–111)
Creatinine: 0.87 mg/dL (ref 0.61–1.24)
GFR, Estimated: >60 mL/min (ref >60)
Glucose, Bld: 121 mg/dL — ABNORMAL HIGH (ref 70–99)
Potassium: 4 mmol/L (ref 3.5–5.1)
Sodium: 140 mmol/L (ref 135–145)
Total Bilirubin: 0.5 mg/dL (ref 0.3–1.2)
Total Protein: 6.7 g/dL (ref 6.5–8.1)

## 2021-09-17 LAB — FERRITIN: Ferritin: 28 ng/mL (ref 24–336)

## 2021-09-24 ENCOUNTER — Encounter: Payer: Self-pay | Admitting: Family Medicine

## 2021-09-24 ENCOUNTER — Ambulatory Visit: Admitting: Family Medicine

## 2021-09-24 VITALS — BP 108/60 | HR 71 | Temp 98.0°F | Ht 69.0 in | Wt 182.4 lb

## 2021-09-24 DIAGNOSIS — M722 Plantar fascial fibromatosis: Secondary | ICD-10-CM | POA: Diagnosis not present

## 2021-09-24 MED ORDER — METHYLPREDNISOLONE ACETATE 40 MG/ML IJ SUSP
20.0000 mg | Freq: Once | INTRAMUSCULAR | Status: AC
  Administered 2021-09-24: 20 mg via INTRA_ARTICULAR

## 2021-09-24 NOTE — Patient Instructions (Signed)
Continue the stretches/exercises, ice, Tylenol.  Let us know if you need anything.

## 2021-09-24 NOTE — Progress Notes (Signed)
Musculoskeletal Exam  Patient: Steve Nolan DOB: [birth year]  DOS: 09/24/2021  SUBJECTIVE:  Chief Complaint:   Chief Complaint  Patient presents with   Plantar Fasciitis    Left foot     Steve Nolan is a 61 y.o.  male for evaluation and treatment of L heel pain.   Onset:  3 months ago. Started after increasing mileage running while training for a 5 k Location: L heel Character:  sharp  Progression of issue:  is unchanged Associated symptoms: no bruising, redness, swelling Treatment: to date has been rest, ice, OTC NSAIDS, acetaminophen, and home exercises.   Neurovascular symptoms: no  Past Medical History:  Diagnosis Date   A-fib ([place])    20 years ago,went to cardiologist no treatment needed and no follow-up - dx. torn muscle.   Chronic low back pain 11/11/2016   Kidney stones    Skin cancer     Objective: VITAL SIGNS: BP 108/60   Pulse 71   Temp 98 F (36.7 C) (Oral)   Ht '5\' 9"'$  (1.753 m)   Wt 182 lb 6 oz (82.7 kg)   SpO2 96%   BMI 26.93 kg/m  Constitutional: Well formed, well developed. No acute distress. Thorax & Lungs: No accessory muscle use Musculoskeletal: L heel.   Tenderness to palpation: yes over prox insertion of plantar fascia Deformity: no Ecchymosis: no Neurologic: Normal sensory function. Psychiatric: Normal mood. Age appropriate judgment and insight. Alert & oriented x 3.    Procedure note; plantar fascia injection Verbal consent obtained The area of interest was palpated and marked with an otoscope speculum It was then cleaned with alcohol x1 Free spray was then used for topical anesthesia 20 mg of Depo-Medrol with 1 mL of 2% lidocaine without epinephrine was injected with a 30-gauge needle The area was then bandaged The patient tolerated the procedure well There were no immediate complications noted  Assessment:  Plantar fasciitis - Plan: PR INJECT TENDON SHEATH/LIGAMENT  Plan: Cont stretches/ice, Tylenol, NSAIDs. Consider  Strassburg sock.   F/u prn. The patient voiced understanding and agreement to the plan.   [place], DO 09/24/21  10:04 AM

## 2021-09-24 NOTE — Addendum Note (Signed)
Addended by: Sharon Seller B on: 09/24/2021 10:20 AM   Modules accepted: Orders

## 2021-09-25 ENCOUNTER — Other Ambulatory Visit: Payer: Self-pay | Admitting: Family Medicine

## 2021-09-28 ENCOUNTER — Ambulatory Visit: Admitting: Family Medicine

## 2021-09-28 ENCOUNTER — Other Ambulatory Visit: Payer: Self-pay | Admitting: Family Medicine

## 2021-09-28 ENCOUNTER — Encounter: Payer: Self-pay | Admitting: Family Medicine

## 2021-09-28 DIAGNOSIS — Z0000 Encounter for general adult medical examination without abnormal findings: Secondary | ICD-10-CM

## 2021-10-03 ENCOUNTER — Other Ambulatory Visit (INDEPENDENT_AMBULATORY_CARE_PROVIDER_SITE_OTHER)

## 2021-10-03 DIAGNOSIS — R972 Elevated prostate specific antigen [PSA]: Secondary | ICD-10-CM

## 2021-10-03 DIAGNOSIS — E7800 Pure hypercholesterolemia, unspecified: Secondary | ICD-10-CM

## 2021-10-03 DIAGNOSIS — Z0000 Encounter for general adult medical examination without abnormal findings: Secondary | ICD-10-CM

## 2021-10-03 LAB — LIPID PANEL
Cholesterol: 168 mg/dL (ref 0–200)
HDL: 43.2 mg/dL (ref >39.00)
LDL Cholesterol: 105 mg/dL — ABNORMAL HIGH (ref 0–99)
NonHDL: 124.95
Total CHOL/HDL Ratio: 4
Triglycerides: 98 mg/dL (ref 0.0–149.0)
VLDL: 19.6 mg/dL (ref 0.0–40.0)

## 2021-10-03 LAB — PSA: PSA: 1.21 ng/mL (ref 0.10–4.00)

## 2021-10-04 ENCOUNTER — Other Ambulatory Visit: Payer: Self-pay | Admitting: Family

## 2021-10-04 DIAGNOSIS — D500 Iron deficiency anemia secondary to blood loss (chronic): Secondary | ICD-10-CM

## 2021-10-04 DIAGNOSIS — D751 Secondary polycythemia: Secondary | ICD-10-CM

## 2021-10-04 LAB — HIV ANTIBODY (ROUTINE TESTING W REFLEX): HIV 1&2 Ab, 4th Generation: NONREACTIVE

## 2021-10-05 ENCOUNTER — Encounter: Payer: Self-pay | Admitting: Family

## 2021-10-05 ENCOUNTER — Inpatient Hospital Stay (HOSPITAL_BASED_OUTPATIENT_CLINIC_OR_DEPARTMENT_OTHER): Admitting: Family

## 2021-10-05 ENCOUNTER — Inpatient Hospital Stay: Attending: Hematology & Oncology

## 2021-10-05 ENCOUNTER — Inpatient Hospital Stay

## 2021-10-05 VITALS — BP 120/78 | HR 68 | Temp 98.1°F | Resp 17 | Wt 182.0 lb

## 2021-10-05 VITALS — BP 119/71 | HR 57 | Resp 17

## 2021-10-05 DIAGNOSIS — Z1589 Genetic susceptibility to other disease: Secondary | ICD-10-CM

## 2021-10-05 DIAGNOSIS — D751 Secondary polycythemia: Secondary | ICD-10-CM

## 2021-10-05 DIAGNOSIS — D500 Iron deficiency anemia secondary to blood loss (chronic): Secondary | ICD-10-CM

## 2021-10-05 LAB — CBC WITH DIFFERENTIAL (CANCER CENTER ONLY)
Abs Immature Granulocytes: 0.01 10*3/uL (ref 0.00–0.07)
Basophils Absolute: 0 10*3/uL (ref 0.0–0.1)
Basophils Relative: 1 %
Eosinophils Absolute: 0.1 10*3/uL (ref 0.0–0.5)
Eosinophils Relative: 2 %
HCT: 45.8 % (ref 39.0–52.0)
Hemoglobin: 15.2 g/dL (ref 13.0–17.0)
Immature Granulocytes: 0 %
Lymphocytes Relative: 43 %
Lymphs Abs: 2.6 10*3/uL (ref 0.7–4.0)
MCH: 27.4 pg (ref 26.0–34.0)
MCHC: 33.2 g/dL (ref 30.0–36.0)
MCV: 82.5 fL (ref 80.0–100.0)
Monocytes Absolute: 0.5 10*3/uL (ref 0.1–1.0)
Monocytes Relative: 9 %
Neutro Abs: 2.7 10*3/uL (ref 1.7–7.7)
Neutrophils Relative %: 45 %
Platelet Count: 212 10*3/uL (ref 150–400)
RBC: 5.55 MIL/uL (ref 4.22–5.81)
RDW: 13.4 % (ref 11.5–15.5)
WBC Count: 6.1 10*3/uL (ref 4.0–10.5)
nRBC: 0 % (ref 0.0–0.2)

## 2021-10-05 LAB — IRON AND IRON BINDING CAPACITY (CC-WL,HP ONLY)
Iron: 46 ug/dL (ref 45–182)
Saturation Ratios: 11 % — ABNORMAL LOW (ref 17.9–39.5)
TIBC: 433 ug/dL (ref 250–450)
UIBC: 387 ug/dL — ABNORMAL HIGH (ref 117–376)

## 2021-10-05 LAB — CMP (CANCER CENTER ONLY)
ALT: 16 U/L (ref 0–44)
AST: 18 U/L (ref 15–41)
Albumin: 4.5 g/dL (ref 3.5–5.0)
Alkaline Phosphatase: 67 U/L (ref 38–126)
Anion gap: 7 (ref 5–15)
BUN: 14 mg/dL (ref 8–23)
CO2: 29 mmol/L (ref 22–32)
Calcium: 9.3 mg/dL (ref 8.9–10.3)
Chloride: 105 mmol/L (ref 98–111)
Creatinine: 0.91 mg/dL (ref 0.61–1.24)
GFR, Estimated: >60 mL/min (ref >60)
Glucose, Bld: 102 mg/dL — ABNORMAL HIGH (ref 70–99)
Potassium: 3.9 mmol/L (ref 3.5–5.1)
Sodium: 141 mmol/L (ref 135–145)
Total Bilirubin: 0.4 mg/dL (ref 0.3–1.2)
Total Protein: 6.5 g/dL (ref 6.5–8.1)

## 2021-10-05 LAB — FERRITIN: Ferritin: 14 ng/mL — ABNORMAL LOW (ref 24–336)

## 2021-10-05 MED ORDER — SODIUM CHLORIDE 0.9 % IV SOLN: INTRAVENOUS | Status: DC

## 2021-10-05 NOTE — Progress Notes (Signed)
Hematology and Oncology Follow Up Visit  Steve Nolan [id number] 02-08-1961 61 y.o. 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 is here today for follow-up. He had fatigue and brain fog several weeks ago and requested to come in early for labs. Hct at that time was 47.9% so he had a phlebotomy. Today's Hct is 45.8%.  He has fatigue and occasional SOB with exertion with intermittent iron deficiency.  He plans to increase hi protein in take with Boost or ensure.  Appetite and hydration have been good. Weight is stable at 182 lbs.  No fever, chills, n/v, cough, rash, dizziness, chest pain, palpitations, abdominal pain or changes in bowel or bladder habits.  No swelling, tenderness, numbness or tingling in his extremities.   No falls or syncope reported.   ECOG Performance Status: 1 - Symptomatic but completely ambulatory  Medications:  Allergies as of 10/05/2021       Reactions   Sulfa Antibiotics Rash        Medication List        Accurate as of October 05, 2021  8:47 AM. If you have any questions, ask your nurse or doctor.          tadalafil 5 MG tablet Commonly known as: CIALIS Take 1 tablet by mouth once daily   VITAMIN B 12 PO Place 1 tablet under the tongue once a week.        Allergies:  Allergies  Allergen Reactions   Sulfa Antibiotics Rash    Past Medical History, Surgical history, Social history, and Family History were reviewed and updated.  Review of Systems: All other 10 point review of systems is negative.   Physical Exam:  weight is 182 lb (82.6 kg). His oral temperature is 98.1 F (36.7 C). His blood pressure is 120/78 and his pulse is 68. His respiration is 17 and oxygen saturation is 99%.   Wt Readings from Last 3  Encounters:  10/05/21 182 lb (82.6 kg)  09/24/21 182 lb 6 oz (82.7 kg)  08/29/21 184 lb 4 oz (83.6 kg)    Ocular: Sclerae unicteric, pupils equal, round and reactive to light Ear-nose-throat: Oropharynx clear, dentition fair Lymphatic: No cervical or supraclavicular adenopathy Lungs no rales or rhonchi, good excursion bilaterally Heart regular rate and rhythm, no murmur appreciated Abd soft, nontender, positive bowel sounds MSK no focal spinal tenderness, no joint edema Neuro: non-focal, well-oriented, appropriate affect Breasts: Deferred   Lab Results  Component Value Date   WBC 6.1 10/05/2021   HGB 15.2 10/05/2021   HCT 45.8 10/05/2021   MCV 82.5 10/05/2021   PLT 212 10/05/2021   Lab Results  Component Value Date   FERRITIN 28 09/17/2021   IRON 99 09/17/2021   TIBC 403 09/17/2021   UIBC 304 09/17/2021   IRONPCTSAT 25 09/17/2021   Lab Results  Component Value Date   RETICCTPCT 2.4 12/04/2020   RBC 5.55 10/05/2021   No results found for: "KPAFRELGTCHN", "LAMBDASER", "KAPLAMBRATIO" No results found for: "IGGSERUM", "IGA", "IGMSERUM" No results found for: "TOTALPROTELP", "ALBUMINELP", "A1GS", "A2GS", "BETS", "BETA2SER", "GAMS", "MSPIKE", "SPEI"   Chemistry      Component Value Date/Time   NA 140 09/17/2021 0756   K 4.0 09/17/2021 0756  CL 104 09/17/2021 0756   CO2 30 09/17/2021 0756   BUN 14 09/17/2021 0756   CREATININE 0.87 09/17/2021 0756      Component Value Date/Time   CALCIUM 9.4 09/17/2021 0756   ALKPHOS 63 09/17/2021 0756   AST 17 09/17/2021 0756   ALT 15 09/17/2021 0756   BILITOT 0.5 09/17/2021 0756       Impression and Plan: [name] is a very pleasant 61 yo gentleman with intermittent mild erythrocytosis. 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  We will do a partial phlebotomy today followed by replacement fluids.  Lab check monthly and  phlebotomy.  Follow-up in 4 months.   Lottie Dawson, NP 7/14/20238:47 AM

## 2021-10-05 NOTE — Progress Notes (Signed)
Charlestine Massed presents today for phlebotomy per MD orders. Phlebotomy procedure started at 0914 and ended at 0923 . 250 grams removed from lt [facility] by DSMith, RN using 18g IV cath.  Patient observed for 30 minutes after procedure without any incident. Patient tolerated procedure well. IV needle removed intact.

## 2021-10-05 NOTE — Patient Instructions (Signed)

## 2021-10-08 ENCOUNTER — Other Ambulatory Visit: Payer: Self-pay | Admitting: Family Medicine

## 2021-10-08 ENCOUNTER — Encounter: Payer: Self-pay | Admitting: Family Medicine

## 2021-10-08 ENCOUNTER — Telehealth: Payer: Self-pay | Admitting: *Deleted

## 2021-10-08 DIAGNOSIS — M722 Plantar fascial fibromatosis: Secondary | ICD-10-CM

## 2021-10-08 NOTE — Telephone Encounter (Signed)
Per 09/25/21 los - called and lvm of upcoming appointments - requested call back to confirm - mailed calendar

## 2021-10-10 ENCOUNTER — Encounter: Payer: Self-pay | Admitting: Hematology & Oncology

## 2021-10-11 ENCOUNTER — Encounter: Payer: Self-pay | Admitting: Hematology & Oncology

## 2021-10-12 ENCOUNTER — Encounter: Payer: Self-pay | Admitting: Podiatry

## 2021-10-12 ENCOUNTER — Ambulatory Visit: Admitting: Podiatry

## 2021-10-12 ENCOUNTER — Ambulatory Visit (INDEPENDENT_AMBULATORY_CARE_PROVIDER_SITE_OTHER)

## 2021-10-12 DIAGNOSIS — M722 Plantar fascial fibromatosis: Secondary | ICD-10-CM

## 2021-10-12 DIAGNOSIS — M79672 Pain in left foot: Secondary | ICD-10-CM | POA: Diagnosis not present

## 2021-10-12 NOTE — Patient Instructions (Signed)

## 2021-10-12 NOTE — Progress Notes (Signed)
  Subjective:  Patient ID: Steve Nolan, male    DOB: 04-Jul-1960,   MRN: [id number]  No chief complaint on file.   61 y.o. male presents for concern of left foot pain that has been going on for a few months.  Hurts the worst after getting up from sitting. Has tried ice water soaks to help with the pain. He has also had injection in the heel about two weeks ago by Dr. Nani Ravens. Unable to take NSAIDs.  Denies any other treatments.  Denies any other pedal complaints. Denies n/v/f/c.   Past Medical History:  Diagnosis Date   A-fib ([place])    20 years ago,went to cardiologist no treatment needed and no follow-up - dx. torn muscle.   Chronic low back pain 11/11/2016   Kidney stones    Skin cancer     Objective:  Physical Exam: Vascular: DP/PT pulses 2/4 bilateral. CFT <3 seconds. Normal hair growth on digits. No edema.  Skin. No lacerations or abrasions bilateral feet.  Musculoskeletal: MMT 5/5 bilateral lower extremities in DF, PF, Inversion and Eversion. Deceased ROM in DF of ankle joint. Tender to medial calcaneal tubercle. No pain along PT, achilles, or arch. No pain with calcaneal squeeze.  Neurological: Sensation intact to light touch.   Assessment:   1. Plantar fasciitis of left foot      Plan:  Patient was evaluated and treated and all questions answered. Discussed plantar fasciitis with patient.  X-rays reviewed and discussed with patient. No acute fractures or dislocations noted. Mild spurring noted at posterior calcaneus.  Discussed treatment options including, ice, NSAIDS, supportive shoes, bracing, and stretching. Stretching exercises provided to be done on a daily basis.   Unable to take NSAIDS and refused steroid pack.  Discussed supportive shoes.  Follow-up 6 weeks or sooner if any problems arise. In the meantime, encouraged to call the office with any questions, concerns, change in symptoms.     Lorenda Peck, DPM

## 2021-11-05 ENCOUNTER — Inpatient Hospital Stay

## 2021-11-05 ENCOUNTER — Inpatient Hospital Stay: Attending: Hematology & Oncology

## 2021-11-05 DIAGNOSIS — D751 Secondary polycythemia: Secondary | ICD-10-CM | POA: Insufficient documentation

## 2021-11-05 DIAGNOSIS — Z1589 Genetic susceptibility to other disease: Secondary | ICD-10-CM

## 2021-11-05 LAB — CMP (CANCER CENTER ONLY)
ALT: 15 U/L (ref 0–44)
AST: 16 U/L (ref 15–41)
Albumin: 4.3 g/dL (ref 3.5–5.0)
Alkaline Phosphatase: 70 U/L (ref 38–126)
Anion gap: 8 (ref 5–15)
BUN: 12 mg/dL (ref 8–23)
CO2: 28 mmol/L (ref 22–32)
Calcium: 9.2 mg/dL (ref 8.9–10.3)
Chloride: 104 mmol/L (ref 98–111)
Creatinine: 0.97 mg/dL (ref 0.61–1.24)
GFR, Estimated: >60 mL/min (ref >60)
Glucose, Bld: 138 mg/dL — ABNORMAL HIGH (ref 70–99)
Potassium: 4 mmol/L (ref 3.5–5.1)
Sodium: 140 mmol/L (ref 135–145)
Total Bilirubin: 0.4 mg/dL (ref 0.3–1.2)
Total Protein: 6.3 g/dL — ABNORMAL LOW (ref 6.5–8.1)

## 2021-11-05 LAB — CBC WITH DIFFERENTIAL (CANCER CENTER ONLY)
Abs Immature Granulocytes: 0.01 10*3/uL (ref 0.00–0.07)
Basophils Absolute: 0 10*3/uL (ref 0.0–0.1)
Basophils Relative: 1 %
Eosinophils Absolute: 0.1 10*3/uL (ref 0.0–0.5)
Eosinophils Relative: 2 %
HCT: 45.9 % (ref 39.0–52.0)
Hemoglobin: 15.1 g/dL (ref 13.0–17.0)
Immature Granulocytes: 0 %
Lymphocytes Relative: 42 %
Lymphs Abs: 2.6 10*3/uL (ref 0.7–4.0)
MCH: 27.4 pg (ref 26.0–34.0)
MCHC: 32.9 g/dL (ref 30.0–36.0)
MCV: 83.2 fL (ref 80.0–100.0)
Monocytes Absolute: 0.5 10*3/uL (ref 0.1–1.0)
Monocytes Relative: 8 %
Neutro Abs: 2.9 10*3/uL (ref 1.7–7.7)
Neutrophils Relative %: 47 %
Platelet Count: 229 10*3/uL (ref 150–400)
RBC: 5.52 MIL/uL (ref 4.22–5.81)
RDW: 12.9 % (ref 11.5–15.5)
WBC Count: 6.1 10*3/uL (ref 4.0–10.5)
nRBC: 0 % (ref 0.0–0.2)

## 2021-11-05 NOTE — Patient Instructions (Signed)

## 2021-11-05 NOTE — Progress Notes (Signed)
Steve Nolan presents today for phlebotomy per MD orders. Phlebotomy procedure started at 0809 and ended at 0825 526 cc removed. Patient tolerated procedure well. IV needle removed intact.

## 2021-11-23 ENCOUNTER — Ambulatory Visit: Admitting: Podiatry

## 2021-11-23 ENCOUNTER — Encounter: Payer: Self-pay | Admitting: Podiatry

## 2021-11-23 DIAGNOSIS — M722 Plantar fascial fibromatosis: Secondary | ICD-10-CM

## 2021-11-23 NOTE — Progress Notes (Signed)
  Subjective:  Patient ID: Steve Nolan, male    DOB: 14-Jul-1960,   MRN: [id number]  Chief Complaint  Patient presents with   Plantar Fasciitis    Left foot PF better since the last visit , patient states he still has pain but nothing like the previous visit , states inserts have been helping    61 y.o. male presents for follow-up of left plantar fasciitis. Relates it is better since last visit. Relates still has some pain. States inserts have been helping. About 90% better.  . Unable to take NSAIDs.  Denies any other treatments.  Denies any other pedal complaints. Denies n/v/f/c.   Past Medical History:  Diagnosis Date   A-fib ([place])    20 years ago,went to cardiologist no treatment needed and no follow-up - dx. torn muscle.   Chronic low back pain 11/11/2016   Kidney stones    Skin cancer     Objective:  Physical Exam: Vascular: DP/PT pulses 2/4 bilateral. CFT <3 seconds. Normal hair growth on digits. No edema.  Skin. No lacerations or abrasions bilateral feet.  Musculoskeletal: MMT 5/5 bilateral lower extremities in DF, PF, Inversion and Eversion. Deceased ROM in DF of ankle joint. Minimally tender to medial calcaneal tubercle. No pain along PT, achilles, or arch. No pain with calcaneal squeeze.  Neurological: Sensation intact to light touch.   Assessment:   1. Plantar fasciitis of left foot      Plan:  Patient was evaluated and treated and all questions answered. Discussed plantar fasciitis with patient.  X-rays reviewed and discussed with patient. No acute fractures or dislocations noted. Mild spurring noted at posterior calcaneus.  Discussed treatment options including, ice, NSAIDS, supportive shoes, bracing, and stretching. Stretching exercises provided to be done on a daily basis.   Unable to take NSAIDS and refused steroid pack.  Continue stretching and supportive shoes.  Follow-up as needed.     Lorenda Peck, DPM

## 2021-12-06 ENCOUNTER — Inpatient Hospital Stay

## 2021-12-06 ENCOUNTER — Inpatient Hospital Stay: Attending: Hematology & Oncology

## 2021-12-06 VITALS — BP 113/69 | HR 72 | Temp 97.9°F | Resp 18

## 2021-12-06 DIAGNOSIS — D751 Secondary polycythemia: Secondary | ICD-10-CM | POA: Insufficient documentation

## 2021-12-06 DIAGNOSIS — Z1589 Genetic susceptibility to other disease: Secondary | ICD-10-CM

## 2021-12-06 LAB — CBC WITH DIFFERENTIAL (CANCER CENTER ONLY)
Abs Immature Granulocytes: 0.01 10*3/uL (ref 0.00–0.07)
Basophils Absolute: 0 10*3/uL (ref 0.0–0.1)
Basophils Relative: 1 %
Eosinophils Absolute: 0.2 10*3/uL (ref 0.0–0.5)
Eosinophils Relative: 3 %
HCT: 45.9 % (ref 39.0–52.0)
Hemoglobin: 15 g/dL (ref 13.0–17.0)
Immature Granulocytes: 0 %
Lymphocytes Relative: 44 %
Lymphs Abs: 2.7 10*3/uL (ref 0.7–4.0)
MCH: 26.9 pg (ref 26.0–34.0)
MCHC: 32.7 g/dL (ref 30.0–36.0)
MCV: 82.4 fL (ref 80.0–100.0)
Monocytes Absolute: 0.5 10*3/uL (ref 0.1–1.0)
Monocytes Relative: 8 %
Neutro Abs: 2.7 10*3/uL (ref 1.7–7.7)
Neutrophils Relative %: 44 %
Platelet Count: 253 10*3/uL (ref 150–400)
RBC: 5.57 MIL/uL (ref 4.22–5.81)
RDW: 12.6 % (ref 11.5–15.5)
WBC Count: 6.1 10*3/uL (ref 4.0–10.5)
nRBC: 0 % (ref 0.0–0.2)

## 2021-12-06 LAB — CMP (CANCER CENTER ONLY)
ALT: 24 U/L (ref 0–44)
AST: 28 U/L (ref 15–41)
Albumin: 4.2 g/dL (ref 3.5–5.0)
Alkaline Phosphatase: 67 U/L (ref 38–126)
Anion gap: 5 (ref 5–15)
BUN: 10 mg/dL (ref 8–23)
CO2: 32 mmol/L (ref 22–32)
Calcium: 9.3 mg/dL (ref 8.9–10.3)
Chloride: 103 mmol/L (ref 98–111)
Creatinine: 0.94 mg/dL (ref 0.61–1.24)
GFR, Estimated: >60 mL/min (ref >60)
Glucose, Bld: 94 mg/dL (ref 70–99)
Potassium: 3.8 mmol/L (ref 3.5–5.1)
Sodium: 140 mmol/L (ref 135–145)
Total Bilirubin: 0.5 mg/dL (ref 0.3–1.2)
Total Protein: 6.9 g/dL (ref 6.5–8.1)

## 2021-12-06 NOTE — Patient Instructions (Signed)

## 2021-12-06 NOTE — Progress Notes (Signed)
Steve Nolan presents today for phlebotomy per MD orders. Phlebotomy procedure started at 0828 and ended at 0834. 521 cc removed via 16 G needle at [facility] site. Patient tolerated procedure well. Refused fluids and to to wait 30 minutes post phlebotomy. Released stable and ASX.

## 2021-12-07 ENCOUNTER — Ambulatory Visit: Admitting: Family Medicine

## 2021-12-07 ENCOUNTER — Encounter: Payer: Self-pay | Admitting: Family Medicine

## 2021-12-07 VITALS — BP 110/72 | HR 74 | Temp 98.3°F | Ht 69.0 in | Wt 182.0 lb

## 2021-12-07 DIAGNOSIS — R519 Headache, unspecified: Secondary | ICD-10-CM

## 2021-12-07 DIAGNOSIS — G8929 Other chronic pain: Secondary | ICD-10-CM | POA: Diagnosis not present

## 2021-12-07 DIAGNOSIS — R310 Gross hematuria: Secondary | ICD-10-CM | POA: Diagnosis not present

## 2021-12-07 NOTE — Patient Instructions (Signed)
If you do not hear anything about your referral in the next 1-2 weeks, call our office and ask for an update.  Someone will reach out regarding the MRI as well.   Let us know if you need anything.

## 2021-12-07 NOTE — Progress Notes (Signed)
Chief Complaint  Patient presents with   scan on brain   Referral    Urologist    Subjective: Patient is a 61 y.o. male here for headache.  This has been going on for 3-4 mo. No inj or change in activity. Thought it was related to PCV, after phlebotomy he will still have ss's. It isn't getting better. Feels like he got punched and has some coordination/confusion issues. No vision changes, difficulty swallowing, trouble w speech, weakness.   Around 2 weeks ago, the patient urinated and saw frank blood that was followed by dark urine later in the day.  He has increased his fluid intake and not noticed any issues since then.  He denies any trauma, pain, discharge, or fevers.  He has a history of kidney stones but notes this does not feel similar to that.  He used to see [facility] and believes he needs a new referral.  Past Medical History:  Diagnosis Date   A-fib ([place])    20 years ago,went to cardiologist no treatment needed and no follow-up - dx. torn muscle.   Chronic low back pain 11/11/2016   Kidney stones    Skin cancer     Objective: BP 110/72   Pulse 74   Temp 98.3 F (36.8 C) (Oral)   Ht '5\' 9"'$  (1.753 m)   Wt 182 lb (82.6 kg)   SpO2 98%   BMI 26.88 kg/m  General: Awake, appears stated age Heart: RRR, no LE edema Lungs: CTAB, no rales, wheezes or rhonchi. No accessory muscle use Neuro: DTRs equal and symmetric throughout, 5/5 strength throughout, fluent speech, gait is normal MSK: No TTP in the suboccipital triangle region or paraspinal cervical musculature. Psych: Age appropriate judgment and insight, normal affect and mood  Assessment and Plan: Chronic nonintractable headache, unspecified headache type - Plan: MR BRAIN WO CONTRAST  Gross hematuria - Plan: Ambulatory referral to Urology  Chronic nature with slight changes given the confusion and coordination issues, will recheck an MRI of his brain.  Follow-up pending the results.  Offered daily prophylactic  medication in the meanwhile which he politely declined. Does not sound like an infection.  Does not sound like a stone.  Will refer back to urology as urine testing today would not likely alter plans with hematuria. Follow-up as originally scheduled. The patient voiced understanding and agreement to the plan.  I spent 30 minutes with the patient discussing the above plans in addition to reviewing his chart on the same day of the visit.  [place], DO 12/07/21  11:25 AM

## 2021-12-12 ENCOUNTER — Ambulatory Visit (HOSPITAL_BASED_OUTPATIENT_CLINIC_OR_DEPARTMENT_OTHER)
Admission: RE | Admit: 2021-12-12 | Discharge: 2021-12-12 | Disposition: A | Discharge status: Home / Self Care | Source: Ambulatory Visit | Attending: Family Medicine | Admitting: Family Medicine

## 2021-12-12 ENCOUNTER — Telehealth (HOSPITAL_BASED_OUTPATIENT_CLINIC_OR_DEPARTMENT_OTHER): Payer: Self-pay

## 2021-12-12 DIAGNOSIS — G8929 Other chronic pain: Secondary | ICD-10-CM | POA: Insufficient documentation

## 2021-12-12 DIAGNOSIS — R519 Headache, unspecified: Secondary | ICD-10-CM | POA: Insufficient documentation

## 2021-12-15 ENCOUNTER — Encounter: Payer: Self-pay | Admitting: Family Medicine

## 2022-01-04 ENCOUNTER — Inpatient Hospital Stay

## 2022-01-04 ENCOUNTER — Inpatient Hospital Stay: Attending: Hematology & Oncology

## 2022-01-04 DIAGNOSIS — D751 Secondary polycythemia: Secondary | ICD-10-CM | POA: Insufficient documentation

## 2022-01-04 DIAGNOSIS — Z1589 Genetic susceptibility to other disease: Secondary | ICD-10-CM

## 2022-01-04 LAB — CMP (CANCER CENTER ONLY)
ALT: 15 U/L (ref 0–44)
AST: 19 U/L (ref 15–41)
Albumin: 4.4 g/dL (ref 3.5–5.0)
Alkaline Phosphatase: 65 U/L (ref 38–126)
Anion gap: 6 (ref 5–15)
BUN: 15 mg/dL (ref 8–23)
CO2: 29 mmol/L (ref 22–32)
Calcium: 9.3 mg/dL (ref 8.9–10.3)
Chloride: 104 mmol/L (ref 98–111)
Creatinine: 0.98 mg/dL (ref 0.61–1.24)
GFR, Estimated: >60 mL/min (ref >60)
Glucose, Bld: 125 mg/dL — ABNORMAL HIGH (ref 70–99)
Potassium: 4.3 mmol/L (ref 3.5–5.1)
Sodium: 139 mmol/L (ref 135–145)
Total Bilirubin: 0.3 mg/dL (ref 0.3–1.2)
Total Protein: 6.8 g/dL (ref 6.5–8.1)

## 2022-01-04 LAB — CBC WITH DIFFERENTIAL (CANCER CENTER ONLY)
Abs Immature Granulocytes: 0.01 10*3/uL (ref 0.00–0.07)
Basophils Absolute: 0 10*3/uL (ref 0.0–0.1)
Basophils Relative: 1 %
Eosinophils Absolute: 0.1 10*3/uL (ref 0.0–0.5)
Eosinophils Relative: 3 %
HCT: 43.6 % (ref 39.0–52.0)
Hemoglobin: 14.1 g/dL (ref 13.0–17.0)
Immature Granulocytes: 0 %
Lymphocytes Relative: 43 %
Lymphs Abs: 2.2 10*3/uL (ref 0.7–4.0)
MCH: 26.3 pg (ref 26.0–34.0)
MCHC: 32.3 g/dL (ref 30.0–36.0)
MCV: 81.2 fL (ref 80.0–100.0)
Monocytes Absolute: 0.4 10*3/uL (ref 0.1–1.0)
Monocytes Relative: 8 %
Neutro Abs: 2.3 10*3/uL (ref 1.7–7.7)
Neutrophils Relative %: 45 %
Platelet Count: 247 10*3/uL (ref 150–400)
RBC: 5.37 MIL/uL (ref 4.22–5.81)
RDW: 12.9 % (ref 11.5–15.5)
WBC Count: 5.1 10*3/uL (ref 4.0–10.5)
nRBC: 0 % (ref 0.0–0.2)

## 2022-01-04 LAB — IRON AND IRON BINDING CAPACITY (CC-WL,HP ONLY)
Iron: 14 ug/dL — ABNORMAL LOW (ref 45–182)
Saturation Ratios: 3 % — ABNORMAL LOW (ref 17.9–39.5)
TIBC: 476 ug/dL — ABNORMAL HIGH (ref 250–450)
UIBC: 462 ug/dL — ABNORMAL HIGH (ref 117–376)

## 2022-01-04 LAB — FERRITIN: Ferritin: 12 ng/mL — ABNORMAL LOW (ref 24–336)

## 2022-02-04 ENCOUNTER — Other Ambulatory Visit: Payer: Self-pay

## 2022-02-04 DIAGNOSIS — Z1589 Genetic susceptibility to other disease: Secondary | ICD-10-CM

## 2022-02-05 ENCOUNTER — Encounter: Payer: Self-pay | Admitting: Hematology & Oncology

## 2022-02-05 ENCOUNTER — Inpatient Hospital Stay: Admitting: Hematology & Oncology

## 2022-02-05 ENCOUNTER — Inpatient Hospital Stay

## 2022-02-05 ENCOUNTER — Inpatient Hospital Stay: Attending: Hematology & Oncology

## 2022-02-05 ENCOUNTER — Other Ambulatory Visit: Payer: Self-pay

## 2022-02-05 VITALS — BP 134/71 | HR 71 | Temp 98.2°F | Resp 18 | Ht 69.0 in | Wt 189.0 lb

## 2022-02-05 DIAGNOSIS — D751 Secondary polycythemia: Secondary | ICD-10-CM | POA: Diagnosis not present

## 2022-02-05 DIAGNOSIS — E611 Iron deficiency: Secondary | ICD-10-CM | POA: Insufficient documentation

## 2022-02-05 DIAGNOSIS — Z1589 Genetic susceptibility to other disease: Secondary | ICD-10-CM

## 2022-02-05 LAB — CMP (CANCER CENTER ONLY)
ALT: 14 U/L (ref 0–44)
AST: 22 U/L (ref 15–41)
Albumin: 4.3 g/dL (ref 3.5–5.0)
Alkaline Phosphatase: 74 U/L (ref 38–126)
Anion gap: 7 (ref 5–15)
BUN: 13 mg/dL (ref 8–23)
CO2: 29 mmol/L (ref 22–32)
Calcium: 9.1 mg/dL (ref 8.9–10.3)
Chloride: 104 mmol/L (ref 98–111)
Creatinine: 0.93 mg/dL (ref 0.61–1.24)
GFR, Estimated: >60 mL/min (ref >60)
Glucose, Bld: 117 mg/dL — ABNORMAL HIGH (ref 70–99)
Potassium: 3.7 mmol/L (ref 3.5–5.1)
Sodium: 140 mmol/L (ref 135–145)
Total Bilirubin: 0.5 mg/dL (ref 0.3–1.2)
Total Protein: 6.8 g/dL (ref 6.5–8.1)

## 2022-02-05 LAB — IRON AND IRON BINDING CAPACITY (CC-WL,HP ONLY)
Iron: 67 ug/dL (ref 45–182)
Saturation Ratios: 14 % — ABNORMAL LOW (ref 17.9–39.5)
TIBC: 480 ug/dL — ABNORMAL HIGH (ref 250–450)
UIBC: 413 ug/dL — ABNORMAL HIGH (ref 117–376)

## 2022-02-05 LAB — CBC WITH DIFFERENTIAL (CANCER CENTER ONLY)
Abs Immature Granulocytes: 0.01 10*3/uL (ref 0.00–0.07)
Basophils Absolute: 0 10*3/uL (ref 0.0–0.1)
Basophils Relative: 1 %
Eosinophils Absolute: 0.1 10*3/uL (ref 0.0–0.5)
Eosinophils Relative: 1 %
HCT: 43.8 % (ref 39.0–52.0)
Hemoglobin: 14.1 g/dL (ref 13.0–17.0)
Immature Granulocytes: 0 %
Lymphocytes Relative: 33 %
Lymphs Abs: 2 10*3/uL (ref 0.7–4.0)
MCH: 25.8 pg — ABNORMAL LOW (ref 26.0–34.0)
MCHC: 32.2 g/dL (ref 30.0–36.0)
MCV: 80.1 fL (ref 80.0–100.0)
Monocytes Absolute: 0.5 10*3/uL (ref 0.1–1.0)
Monocytes Relative: 8 %
Neutro Abs: 3.4 10*3/uL (ref 1.7–7.7)
Neutrophils Relative %: 57 %
Platelet Count: 247 10*3/uL (ref 150–400)
RBC: 5.47 MIL/uL (ref 4.22–5.81)
RDW: 13.4 % (ref 11.5–15.5)
WBC Count: 6 10*3/uL (ref 4.0–10.5)
nRBC: 0 % (ref 0.0–0.2)

## 2022-02-05 LAB — FERRITIN: Ferritin: 12 ng/mL — ABNORMAL LOW (ref 24–336)

## 2022-02-05 NOTE — Progress Notes (Signed)
Hematology and Oncology Follow Up Visit  Steve Nolan [id number] 1961-03-18 61 y.o. 02/05/2022   Principle Diagnosis:  ErythrocytosisJ -- JAK 2 (-)/ ASXL1 (+)     Current Therapy:        Phlebotomy to maintain Hct < 45%   Interim History:  Steve Nolan is here today for follow-up.  This is the first time seeing him.  He has been followed quite closely by Judson Roch.  He is very nice.  He served in [employer].  As such, is a true [employer] hero.  We last saw him back in July.  At that time, everything was doing well.  He has had no issues with headache.  He has had no problems with cough or shortness of breath.  He has had no chest wall pain.  His brother, who is 2 years younger than him, died at recently have a heart attack.  This has been a little bit stressful.  He had iron studies done about 10 days ago.  His ferritin was 12 with an iron saturation of 3%.  He has had no fatigue.  He has had no change of bowel or bladder habits.  He has had no rashes.  There is been no leg swelling.  Next year, we taken a trip to [place].  He will be on a cruise.  This will be about 10 days.  Currently, his performance status is ECOG 0.  Medications:  Allergies as of 02/05/2022       Reactions   Sulfur Other (See Comments)   Sulfa Antibiotics Rash        Medication List        Accurate as of February 05, 2022  9:11 AM. If you have any questions, ask your nurse or doctor.          fluorouracil 5 % cream Commonly known as: EFUDEX 1 application to affected area face Externally once  a day for 14 days   tadalafil 5 MG tablet Commonly known as: CIALIS Take 1 tablet by mouth once daily   VITAMIN B 12 PO Place 1 tablet under the tongue once a week.        Allergies:  Allergies  Allergen Reactions   Sulfur Other (See Comments)   Sulfa Antibiotics Rash    Past Medical History, Surgical history, Social history, and Family History were reviewed and  updated.  Review of Systems: Review of Systems  Constitutional: Negative.   HENT: Negative.    Eyes: Negative.   Respiratory: Negative.    Cardiovascular: Negative.   Gastrointestinal: Negative.   Genitourinary: Negative.   Musculoskeletal: Negative.   Skin: Negative.   Neurological: Negative.   Endo/Heme/Allergies: Negative.   Psychiatric/Behavioral: Negative.       Physical Exam:  height is _0  (1.753 m) and weight is 189 lb (85.7 kg). His oral temperature is 98.2 F (36.8 C). His blood pressure is 134/71 and his pulse is 71. His respiration is 18 and oxygen saturation is 99%.   Wt Readings from Last 3 Encounters:  02/05/22 189 lb (85.7 kg)  12/07/21 182 lb (82.6 kg)  10/05/21 182 lb (82.6 kg)    Physical Exam Vitals reviewed.  HENT:     Head: Normocephalic and atraumatic.  Eyes:     Pupils: Pupils are equal, round, and reactive to light.  Cardiovascular:     Rate and Rhythm: Normal rate and regular rhythm.     Heart sounds: Normal heart sounds.  Pulmonary:  Effort: Pulmonary effort is normal.     Breath sounds: Normal breath sounds.  Abdominal:     General: Bowel sounds are normal.     Palpations: Abdomen is soft.  Musculoskeletal:        General: No tenderness or deformity. Normal range of motion.     Cervical back: Normal range of motion.  Lymphadenopathy:     Cervical: No cervical adenopathy.  Skin:    General: Skin is warm and dry.     Findings: No erythema or rash.  Neurological:     Mental Status: He is alert and oriented to person, place, and time.  Psychiatric:        Behavior: Behavior normal.        Thought Content: Thought content normal.        Judgment: Judgment normal.      Lab Results  Component Value Date   WBC 6.0 02/05/2022   HGB 14.1 02/05/2022   HCT 43.8 02/05/2022   MCV 80.1 02/05/2022   PLT 247 02/05/2022   Lab Results  Component Value Date   FERRITIN 12 (L) 01/04/2022   IRON 14 (L) 01/04/2022   TIBC 476 (H)  01/04/2022   UIBC 462 (H) 01/04/2022   IRONPCTSAT 3 (L) 01/04/2022   Lab Results  Component Value Date   RETICCTPCT 2.4 12/04/2020   RBC 5.47 02/05/2022   No results found for: "KPAFRELGTCHN", "LAMBDASER", "KAPLAMBRATIO" No results found for: "IGGSERUM", "IGA", "IGMSERUM" No results found for: "TOTALPROTELP", "ALBUMINELP", "A1GS", "A2GS", "BETS", "BETA2SER", "GAMS", "MSPIKE", "SPEI"   Chemistry      Component Value Date/Time   NA 139 01/04/2022 0824   K 4.3 01/04/2022 0824   CL 104 01/04/2022 0824   CO2 29 01/04/2022 0824   BUN 15 01/04/2022 0824   CREATININE 0.98 01/04/2022 0824      Component Value Date/Time   CALCIUM 9.3 01/04/2022 0824   ALKPHOS 65 01/04/2022 0824   AST 19 01/04/2022 0824   ALT 15 01/04/2022 0824   BILITOT 0.3 01/04/2022 0824       Impression and Plan: Mr. [name] is a very pleasant 60 yo gentleman with intermittent mild erythrocytosis.  He is JAK2 negative.  We have not had to do a bone marrow biopsy on him.  I really do not think a bone marrow biopsy were really I was out right now.  He does not need to have a phlebotomy today.  I think his hemoglobin is doing fine.  He clearly is iron deficient.  This may contribute to him having little bit of fatigue.  I do not see a problem with him going after around the world for a cruise.  He cannot take aspirin because of ulcer history.  I told to make sure that he stays as hydrated as possible.  For right now, we will plan to get him back in 6 months.  I do not see any evidence that there is any issue with respect to myelodysplasia.     Volanda Napoleon, MD 11/14/20239:11 AM

## 2022-02-21 ENCOUNTER — Telehealth: Payer: Self-pay | Admitting: *Deleted

## 2022-02-21 NOTE — Telephone Encounter (Signed)
Message received from patient stating that he feels as though his blood counts are up and that he needs a phlebotomy.  Message sent to scheduling.

## 2022-02-22 ENCOUNTER — Encounter: Payer: Self-pay | Admitting: Hematology & Oncology

## 2022-02-26 ENCOUNTER — Inpatient Hospital Stay: Attending: Hematology & Oncology

## 2022-02-26 ENCOUNTER — Inpatient Hospital Stay

## 2022-02-26 VITALS — BP 110/67 | HR 77 | Temp 97.9°F | Resp 18

## 2022-02-26 DIAGNOSIS — D751 Secondary polycythemia: Secondary | ICD-10-CM | POA: Diagnosis present

## 2022-02-26 DIAGNOSIS — Z1589 Genetic susceptibility to other disease: Secondary | ICD-10-CM

## 2022-02-26 LAB — CBC WITH DIFFERENTIAL (CANCER CENTER ONLY)
Abs Immature Granulocytes: 0.01 10*3/uL (ref 0.00–0.07)
Basophils Absolute: 0 10*3/uL (ref 0.0–0.1)
Basophils Relative: 0 %
Eosinophils Absolute: 0.1 10*3/uL (ref 0.0–0.5)
Eosinophils Relative: 2 %
HCT: 46.2 % (ref 39.0–52.0)
Hemoglobin: 14.7 g/dL (ref 13.0–17.0)
Immature Granulocytes: 0 %
Lymphocytes Relative: 32 %
Lymphs Abs: 2.6 10*3/uL (ref 0.7–4.0)
MCH: 25.6 pg — ABNORMAL LOW (ref 26.0–34.0)
MCHC: 31.8 g/dL (ref 30.0–36.0)
MCV: 80.5 fL (ref 80.0–100.0)
Monocytes Absolute: 0.6 10*3/uL (ref 0.1–1.0)
Monocytes Relative: 8 %
Neutro Abs: 4.7 10*3/uL (ref 1.7–7.7)
Neutrophils Relative %: 58 %
Platelet Count: 250 10*3/uL (ref 150–400)
RBC: 5.74 MIL/uL (ref 4.22–5.81)
RDW: 13.9 % (ref 11.5–15.5)
WBC Count: 8.1 10*3/uL (ref 4.0–10.5)
nRBC: 0 % (ref 0.0–0.2)

## 2022-02-26 LAB — CMP (CANCER CENTER ONLY)
ALT: 15 U/L (ref 0–44)
AST: 17 U/L (ref 15–41)
Albumin: 4.3 g/dL (ref 3.5–5.0)
Alkaline Phosphatase: 77 U/L (ref 38–126)
Anion gap: 7 (ref 5–15)
BUN: 14 mg/dL (ref 8–23)
CO2: 29 mmol/L (ref 22–32)
Calcium: 9.1 mg/dL (ref 8.9–10.3)
Chloride: 102 mmol/L (ref 98–111)
Creatinine: 0.89 mg/dL (ref 0.61–1.24)
GFR, Estimated: >60 mL/min (ref >60)
Glucose, Bld: 168 mg/dL — ABNORMAL HIGH (ref 70–99)
Potassium: 4.2 mmol/L (ref 3.5–5.1)
Sodium: 138 mmol/L (ref 135–145)
Total Bilirubin: 0.3 mg/dL (ref 0.3–1.2)
Total Protein: 7.1 g/dL (ref 6.5–8.1)

## 2022-02-26 NOTE — Progress Notes (Signed)
Steve Nolan presents today for phlebotomy per MD orders. Phlebotomy procedure started at 0823 and ended at 0828. 541 cc removed via 16 G needle at [facility] site. Patient tolerated procedure well. Patient refused to wait 30 minutes after phlebotomy, released stable and ASX.

## 2022-02-26 NOTE — Patient Instructions (Signed)

## 2022-04-16 ENCOUNTER — Other Ambulatory Visit: Payer: Self-pay

## 2022-04-16 ENCOUNTER — Encounter: Payer: Self-pay | Admitting: Hematology & Oncology

## 2022-04-17 ENCOUNTER — Inpatient Hospital Stay

## 2022-04-17 ENCOUNTER — Inpatient Hospital Stay: Attending: Hematology & Oncology

## 2022-04-17 VITALS — BP 119/66 | HR 70 | Temp 98.0°F | Resp 16

## 2022-04-17 DIAGNOSIS — D751 Secondary polycythemia: Secondary | ICD-10-CM

## 2022-04-17 LAB — CBC WITH DIFFERENTIAL (CANCER CENTER ONLY)
Abs Immature Granulocytes: 0.01 10*3/uL (ref 0.00–0.07)
Basophils Absolute: 0 10*3/uL (ref 0.0–0.1)
Basophils Relative: 1 %
Eosinophils Absolute: 0.1 10*3/uL (ref 0.0–0.5)
Eosinophils Relative: 1 %
HCT: 47.3 % (ref 39.0–52.0)
Hemoglobin: 15.1 g/dL (ref 13.0–17.0)
Immature Granulocytes: 0 %
Lymphocytes Relative: 36 %
Lymphs Abs: 2.4 10*3/uL (ref 0.7–4.0)
MCH: 25.1 pg — ABNORMAL LOW (ref 26.0–34.0)
MCHC: 31.9 g/dL (ref 30.0–36.0)
MCV: 78.6 fL — ABNORMAL LOW (ref 80.0–100.0)
Monocytes Absolute: 0.6 10*3/uL (ref 0.1–1.0)
Monocytes Relative: 9 %
Neutro Abs: 3.5 10*3/uL (ref 1.7–7.7)
Neutrophils Relative %: 53 %
Platelet Count: 284 10*3/uL (ref 150–400)
RBC: 6.02 MIL/uL — ABNORMAL HIGH (ref 4.22–5.81)
RDW: 14.4 % (ref 11.5–15.5)
WBC Count: 6.6 10*3/uL (ref 4.0–10.5)
nRBC: 0 % (ref 0.0–0.2)

## 2022-04-17 NOTE — Progress Notes (Signed)
Steve Nolan presents today for phlebotomy per MD orders. Phlebotomy procedure started in left antecubital at 1105 with 16 g phlebotomy kit and ended at 1115. 549 grams removed. Patient did not want refreshments. Patient refused to stay for observation period.  VSS. Patient tolerated procedure well. IV needle removed intact.

## 2022-04-17 NOTE — Patient Instructions (Signed)

## 2022-06-11 ENCOUNTER — Other Ambulatory Visit: Payer: Self-pay

## 2022-06-12 ENCOUNTER — Inpatient Hospital Stay: Attending: Hematology & Oncology

## 2022-06-12 ENCOUNTER — Inpatient Hospital Stay

## 2022-06-12 DIAGNOSIS — D751 Secondary polycythemia: Secondary | ICD-10-CM | POA: Diagnosis not present

## 2022-06-12 LAB — CBC WITH DIFFERENTIAL (CANCER CENTER ONLY)
Abs Immature Granulocytes: 0.01 10*3/uL (ref 0.00–0.07)
Basophils Absolute: 0 10*3/uL (ref 0.0–0.1)
Basophils Relative: 0 %
Eosinophils Absolute: 0.2 10*3/uL (ref 0.0–0.5)
Eosinophils Relative: 2 %
HCT: 43.3 % (ref 39.0–52.0)
Hemoglobin: 13.9 g/dL (ref 13.0–17.0)
Immature Granulocytes: 0 %
Lymphocytes Relative: 28 %
Lymphs Abs: 1.9 10*3/uL (ref 0.7–4.0)
MCH: 25.4 pg — ABNORMAL LOW (ref 26.0–34.0)
MCHC: 32.1 g/dL (ref 30.0–36.0)
MCV: 79 fL — ABNORMAL LOW (ref 80.0–100.0)
Monocytes Absolute: 0.5 10*3/uL (ref 0.1–1.0)
Monocytes Relative: 8 %
Neutro Abs: 4.3 10*3/uL (ref 1.7–7.7)
Neutrophils Relative %: 62 %
Platelet Count: 239 10*3/uL (ref 150–400)
RBC: 5.48 MIL/uL (ref 4.22–5.81)
RDW: 14.3 % (ref 11.5–15.5)
WBC Count: 7 10*3/uL (ref 4.0–10.5)
nRBC: 0 % (ref 0.0–0.2)

## 2022-06-12 LAB — CMP (CANCER CENTER ONLY)
ALT: 18 U/L (ref 0–44)
AST: 20 U/L (ref 15–41)
Albumin: 4.3 g/dL (ref 3.5–5.0)
Alkaline Phosphatase: 66 U/L (ref 38–126)
Anion gap: 8 (ref 5–15)
BUN: 12 mg/dL (ref 8–23)
CO2: 29 mmol/L (ref 22–32)
Calcium: 9 mg/dL (ref 8.9–10.3)
Chloride: 103 mmol/L (ref 98–111)
Creatinine: 0.94 mg/dL (ref 0.61–1.24)
GFR, Estimated: >60 mL/min (ref >60)
Glucose, Bld: 146 mg/dL — ABNORMAL HIGH (ref 70–99)
Potassium: 4 mmol/L (ref 3.5–5.1)
Sodium: 140 mmol/L (ref 135–145)
Total Bilirubin: 0.5 mg/dL (ref 0.3–1.2)
Total Protein: 6.6 g/dL (ref 6.5–8.1)

## 2022-07-08 ENCOUNTER — Encounter: Payer: Self-pay | Admitting: *Deleted

## 2022-08-02 ENCOUNTER — Telehealth: Payer: Self-pay | Admitting: Family Medicine

## 2022-08-02 ENCOUNTER — Other Ambulatory Visit: Payer: Self-pay | Admitting: Family Medicine

## 2022-08-02 DIAGNOSIS — Z0000 Encounter for general adult medical examination without abnormal findings: Secondary | ICD-10-CM

## 2022-08-02 NOTE — Telephone Encounter (Signed)
PSA, CBC, CMP, lipid panel, HIV. Ty.

## 2022-08-02 NOTE — Telephone Encounter (Signed)
Pt called stating he would like to have his CPE labs done prior to his appt.

## 2022-08-02 NOTE — Telephone Encounter (Signed)
If ok what labs to order. CPE on 09/02/2022

## 2022-08-02 NOTE — Telephone Encounter (Signed)
Called the patient and scheduled lab appt/ put in the order. 

## 2022-08-05 ENCOUNTER — Other Ambulatory Visit: Payer: Self-pay | Admitting: *Deleted

## 2022-08-05 DIAGNOSIS — D500 Iron deficiency anemia secondary to blood loss (chronic): Secondary | ICD-10-CM

## 2022-08-06 ENCOUNTER — Encounter: Payer: Self-pay | Admitting: Hematology & Oncology

## 2022-08-06 ENCOUNTER — Inpatient Hospital Stay: Attending: Hematology & Oncology

## 2022-08-06 ENCOUNTER — Inpatient Hospital Stay

## 2022-08-06 ENCOUNTER — Inpatient Hospital Stay (HOSPITAL_BASED_OUTPATIENT_CLINIC_OR_DEPARTMENT_OTHER): Admitting: Hematology & Oncology

## 2022-08-06 DIAGNOSIS — D500 Iron deficiency anemia secondary to blood loss (chronic): Secondary | ICD-10-CM

## 2022-08-06 DIAGNOSIS — D751 Secondary polycythemia: Secondary | ICD-10-CM | POA: Insufficient documentation

## 2022-08-06 LAB — CMP (CANCER CENTER ONLY)
ALT: 15 U/L (ref 0–44)
AST: 19 U/L (ref 15–41)
Albumin: 4.5 g/dL (ref 3.5–5.0)
Alkaline Phosphatase: 73 U/L (ref 38–126)
Anion gap: 6 (ref 5–15)
BUN: 10 mg/dL (ref 8–23)
CO2: 32 mmol/L (ref 22–32)
Calcium: 9.3 mg/dL (ref 8.9–10.3)
Chloride: 103 mmol/L (ref 98–111)
Creatinine: 0.89 mg/dL (ref 0.61–1.24)
GFR, Estimated: >60 mL/min (ref >60)
Glucose, Bld: 140 mg/dL — ABNORMAL HIGH (ref 70–99)
Potassium: 3.9 mmol/L (ref 3.5–5.1)
Sodium: 141 mmol/L (ref 135–145)
Total Bilirubin: 0.5 mg/dL (ref 0.3–1.2)
Total Protein: 6.8 g/dL (ref 6.5–8.1)

## 2022-08-06 LAB — CBC WITH DIFFERENTIAL (CANCER CENTER ONLY)
Abs Immature Granulocytes: 0.01 10*3/uL (ref 0.00–0.07)
Basophils Absolute: 0 10*3/uL (ref 0.0–0.1)
Basophils Relative: 1 %
Eosinophils Absolute: 0.2 10*3/uL (ref 0.0–0.5)
Eosinophils Relative: 3 %
HCT: 47.1 % (ref 39.0–52.0)
Hemoglobin: 15.1 g/dL (ref 13.0–17.0)
Immature Granulocytes: 0 %
Lymphocytes Relative: 42 %
Lymphs Abs: 2.4 10*3/uL (ref 0.7–4.0)
MCH: 25.3 pg — ABNORMAL LOW (ref 26.0–34.0)
MCHC: 32.1 g/dL (ref 30.0–36.0)
MCV: 78.8 fL — ABNORMAL LOW (ref 80.0–100.0)
Monocytes Absolute: 0.4 10*3/uL (ref 0.1–1.0)
Monocytes Relative: 7 %
Neutro Abs: 2.7 10*3/uL (ref 1.7–7.7)
Neutrophils Relative %: 47 %
Platelet Count: 225 10*3/uL (ref 150–400)
RBC: 5.98 MIL/uL — ABNORMAL HIGH (ref 4.22–5.81)
RDW: 14.6 % (ref 11.5–15.5)
WBC Count: 5.6 10*3/uL (ref 4.0–10.5)
nRBC: 0 % (ref 0.0–0.2)

## 2022-08-06 LAB — IRON AND IRON BINDING CAPACITY (CC-WL,HP ONLY)
Iron: 108 ug/dL (ref 45–182)
Saturation Ratios: 23 % (ref 17.9–39.5)
TIBC: 468 ug/dL — ABNORMAL HIGH (ref 250–450)
UIBC: 360 ug/dL (ref 117–376)

## 2022-08-06 LAB — FERRITIN: Ferritin: 14 ng/mL — ABNORMAL LOW (ref 24–336)

## 2022-08-06 NOTE — Progress Notes (Signed)
Steve Nolan presents today for phlebotomy per MD orders. Phlebotomy procedure started at 0855 and ended at 0902. 530 cc removed via 16 G needle at [facility] site. Patient tolerated procedure well. [id number]

## 2022-08-06 NOTE — Progress Notes (Signed)
Hematology and Oncology Follow Up Visit  Steve Nolan [id number] 1960/04/16 62 y.o. 08/06/2022   Principle Diagnosis:  Erythrocytosis -- JAK 2 (-)/ ASXL1 (+)     Current Therapy:        Phlebotomy to maintain Hct < 45%   Interim History:  Steve Nolan is here today for follow-up.  We see him every 6 months.  He does come in before that if he thinks he needs a phlebotomy.  He has been in 3 times since we last saw him.  He has been doing fairly well.  He gets phlebotomies every time he comes in.  He says this makes him feel better.  He knows when he needs his phlebotomy.  He did have some bladder stones.  He did see a urologist.  He had a little bit of a enlarged prostate.  He is taking some natural supplements to decrease his prostate size.  He says that he is urinating much better.  He has had no problems with fever.  He has had no problems with rashes.  There is been no leg swelling.  His last iron studies that we saw back in November showed a ferritin of 12 and iron saturation of 14%.  He is trying to plan to go to [place] next year.  He may go on a [place] cruise this year.  Thankfully, he has had no problems with COVID.  Overall, I would say that his performance status is probably ECOG 0.    Medications:  Allergies as of 08/06/2022       Reactions   Sulfur Other (See Comments)   Sulfa Antibiotics Rash        Medication List        Accurate as of Aug 06, 2022  9:23 AM. If you have any questions, ask your nurse or doctor.          STOP taking these medications    fluorouracil 5 % cream Commonly known as: EFUDEX Stopped by: Josph Macho, MD       TAKE these medications    tadalafil 5 MG tablet Commonly known as: CIALIS Take 1 tablet by mouth once daily   VITAMIN B 12 PO Place 1 tablet under the tongue once a week.        Allergies:  Allergies  Allergen Reactions   Sulfur Other (See Comments)   Sulfa Antibiotics Rash    Past Medical  History, Surgical history, Social history, and Family History were reviewed and updated.  Review of Systems: Review of Systems  Constitutional: Negative.   HENT: Negative.    Eyes: Negative.   Respiratory: Negative.    Cardiovascular: Negative.   Gastrointestinal: Negative.   Genitourinary: Negative.   Musculoskeletal: Negative.   Skin: Negative.   Neurological: Negative.   Endo/Heme/Allergies: Negative.   Psychiatric/Behavioral: Negative.       Physical Exam:  height is 5\' 9"  (1.753 m) and weight is 186 lb 1.9 oz (84.4 kg). His oral temperature is 97.7 F (36.5 C). His blood pressure is 137/86 and his pulse is 71. His respiration is 20 and oxygen saturation is 100%.   Wt Readings from Last 3 Encounters:  08/06/22 186 lb 1.9 oz (84.4 kg)  02/05/22 189 lb (85.7 kg)  12/07/21 182 lb (82.6 kg)    Physical Exam Vitals reviewed.  HENT:     Head: Normocephalic and atraumatic.  Eyes:     Pupils: Pupils are equal, round, and reactive to light.  Cardiovascular:  Rate and Rhythm: Normal rate and regular rhythm.     Heart sounds: Normal heart sounds.  Pulmonary:     Effort: Pulmonary effort is normal.     Breath sounds: Normal breath sounds.  Abdominal:     General: Bowel sounds are normal.     Palpations: Abdomen is soft.  Musculoskeletal:        General: No tenderness or deformity. Normal range of motion.     Cervical back: Normal range of motion.  Lymphadenopathy:     Cervical: No cervical adenopathy.  Skin:    General: Skin is warm and dry.     Findings: No erythema or rash.  Neurological:     Mental Status: He is alert and oriented to person, place, and time.  Psychiatric:        Behavior: Behavior normal.        Thought Content: Thought content normal.        Judgment: Judgment normal.      Lab Results  Component Value Date   WBC 5.6 08/06/2022   HGB 15.1 08/06/2022   HCT 47.1 08/06/2022   MCV 78.8 (L) 08/06/2022   PLT 225 08/06/2022   Lab Results   Component Value Date   FERRITIN 12 (L) 02/05/2022   IRON 67 02/05/2022   TIBC 480 (H) 02/05/2022   UIBC 413 (H) 02/05/2022   IRONPCTSAT 14 (L) 02/05/2022   Lab Results  Component Value Date   RETICCTPCT 2.4 12/04/2020   RBC 5.98 (H) 08/06/2022   No results found for: "KPAFRELGTCHN", "LAMBDASER", "KAPLAMBRATIO" No results found for: "IGGSERUM", "IGA", "IGMSERUM" No results found for: "TOTALPROTELP", "ALBUMINELP", "A1GS", "A2GS", "BETS", "BETA2SER", "GAMS", "MSPIKE", "SPEI"   Chemistry      Component Value Date/Time   NA 141 08/06/2022 0759   K 3.9 08/06/2022 0759   CL 103 08/06/2022 0759   CO2 32 08/06/2022 0759   BUN 10 08/06/2022 0759   CREATININE 0.89 08/06/2022 0759      Component Value Date/Time   CALCIUM 9.3 08/06/2022 0759   ALKPHOS 73 08/06/2022 0759   AST 19 08/06/2022 0759   ALT 15 08/06/2022 0759   BILITOT 0.5 08/06/2022 0759       Impression and Plan: Steve Nolan is a very pleasant 62 yo gentleman with intermittent mild erythrocytosis.  He is JAK2 negative.  We have not had to do a bone marrow biopsy on him.  I really do not think a bone marrow biopsy were really I was out right now.  We did go and do a phlebotomy on him today.  He says this was makes him feel better.  Of note, he cannot take aspirin because of history of ulcers.  We will still plan to get him back in 6 months.  He will come back when he needs to before then for a phlebotomy.      Josph Macho, MD 5/14/20249:23 AM

## 2022-08-29 ENCOUNTER — Other Ambulatory Visit (INDEPENDENT_AMBULATORY_CARE_PROVIDER_SITE_OTHER)

## 2022-08-29 DIAGNOSIS — Z0000 Encounter for general adult medical examination without abnormal findings: Secondary | ICD-10-CM

## 2022-08-29 LAB — COMPREHENSIVE METABOLIC PANEL
ALT: 16 U/L (ref 0–53)
AST: 23 U/L (ref 0–37)
Albumin: 4.4 g/dL (ref 3.5–5.2)
Alkaline Phosphatase: 65 U/L (ref 39–117)
BUN: 11 mg/dL (ref 6–23)
CO2: 29 mEq/L (ref 19–32)
Calcium: 9 mg/dL (ref 8.4–10.5)
Chloride: 103 mEq/L (ref 96–112)
Creatinine, Ser: 0.93 mg/dL (ref 0.40–1.50)
GFR: 87.59 mL/min (ref >60.00)
Glucose, Bld: 84 mg/dL (ref 70–99)
Potassium: 4.3 mEq/L (ref 3.5–5.1)
Sodium: 140 mEq/L (ref 135–145)
Total Bilirubin: 0.5 mg/dL (ref 0.2–1.2)
Total Protein: 6.8 g/dL (ref 6.0–8.3)

## 2022-08-29 LAB — CBC
HCT: 42.8 % (ref 39.0–52.0)
Hemoglobin: 13.8 g/dL (ref 13.0–17.0)
MCHC: 32.3 g/dL (ref 30.0–36.0)
MCV: 79.7 fl (ref 78.0–100.0)
Platelets: 259 10*3/uL (ref 150.0–400.0)
RBC: 5.37 Mil/uL (ref 4.22–5.81)
RDW: 16.5 % — ABNORMAL HIGH (ref 11.5–15.5)
WBC: 4.8 10*3/uL (ref 4.0–10.5)

## 2022-08-29 LAB — LIPID PANEL
Cholesterol: 154 mg/dL (ref 0–200)
HDL: 45.6 mg/dL (ref >39.00)
LDL Cholesterol: 93 mg/dL (ref 0–99)
NonHDL: 108
Total CHOL/HDL Ratio: 3
Triglycerides: 76 mg/dL (ref 0.0–149.0)
VLDL: 15.2 mg/dL (ref 0.0–40.0)

## 2022-08-29 LAB — PSA: PSA: 1.24 ng/mL (ref 0.10–4.00)

## 2022-08-30 LAB — HIV ANTIBODY (ROUTINE TESTING W REFLEX): HIV 1&2 Ab, 4th Generation: NONREACTIVE

## 2022-09-02 ENCOUNTER — Ambulatory Visit (INDEPENDENT_AMBULATORY_CARE_PROVIDER_SITE_OTHER): Admitting: Family Medicine

## 2022-09-02 ENCOUNTER — Encounter: Payer: Self-pay | Admitting: Family Medicine

## 2022-09-02 VITALS — BP 120/80 | HR 68 | Temp 97.7°F | Ht 69.0 in | Wt 186.0 lb

## 2022-09-02 DIAGNOSIS — Z1283 Encounter for screening for malignant neoplasm of skin: Secondary | ICD-10-CM | POA: Diagnosis not present

## 2022-09-02 DIAGNOSIS — Z114 Encounter for screening for human immunodeficiency virus [HIV]: Secondary | ICD-10-CM

## 2022-09-02 DIAGNOSIS — Z125 Encounter for screening for malignant neoplasm of prostate: Secondary | ICD-10-CM | POA: Diagnosis not present

## 2022-09-02 DIAGNOSIS — Z0000 Encounter for general adult medical examination without abnormal findings: Secondary | ICD-10-CM | POA: Diagnosis not present

## 2022-09-02 MED ORDER — TADALAFIL 5 MG PO TABS: 5.0000 mg | ORAL_TABLET | Freq: Every day | ORAL | 3 refills | Status: DC

## 2022-09-02 NOTE — Patient Instructions (Signed)
Keep the diet clean and stay active.  Please get me a copy of your advanced directive form at your convenience.   Let us know if you need anything.  

## 2022-09-02 NOTE — Progress Notes (Signed)
Chief Complaint  Patient presents with   Annual Exam    Well Male Steve Nolan is here for a complete physical.   His last physical was >1 year ago.  Current diet: in general, a "healthy" diet.  Current exercise: wt lifting Weight trend: stable Fatigue out of ordinary? Not out of the ordinary. Seat belt? Yes.   Advanced directive? Yes  Health maintenance Shingrix- Yes Colonoscopy- Yes Tetanus- Yes HIV- Yes Hep C- Yes   Past Medical History:  Diagnosis Date   A-fib ([facility])    20 years ago,went to cardiologist no treatment needed and no follow-up - dx. torn muscle.   Chronic low back pain 11/11/2016   Kidney stones    Skin cancer     Past Surgical History:  Procedure Laterality Date   COLONOSCOPY     around 2011. Dr Lucile Crater. [facility]. [place] Spaniel he was due in 2021.    ESOPHAGOGASTRODUODENOSCOPY     Around 2011   EXTRACORPOREAL SHOCK WAVE LITHOTRIPSY Left 01/09/2017   Procedure: LEFT EXTRACORPOREAL SHOCK WAVE LITHOTRIPSY (ESWL);  Surgeon: Malen Gauze, MD;  Location: [facility];  Service: Urology;  Laterality: Left;   FUNCTIONAL ENDOSCOPIC SINUS SURGERY Bilateral 1998   deviated septum   left shoulder surgery Left    arthoscopic    Medications  Current Outpatient Medications on File Prior to Visit  Medication Sig Dispense Refill   Cyanocobalamin (VITAMIN B 12 PO) Place 1 tablet under the tongue once a week.      No current facility-administered medications on file prior to visit.     Allergies Allergies  Allergen Reactions   Sulfur Other (See Comments)   Sulfa Antibiotics Rash    Family History Family History  Problem Relation Age of Onset   Hypertension Mother    Diabetes Mother    Non-Hodgkin's lymphoma Mother    Heart disease Father    Skin cancer Father    Lung cancer Sister    Non-Hodgkin's lymphoma Sister    Stroke Maternal Grandmother    Hypertension Maternal Grandmother    Drug abuse Maternal Grandmother    Heart disease Maternal  Grandfather    Hypertension Maternal Grandfather    Heart disease Paternal Grandfather    Colon cancer Neg Hx    Esophageal cancer Neg Hx    Prostate cancer Neg Hx     Review of Systems: Constitutional:  no fevers Eye:  no recent significant change in vision Ear/Nose/Mouth/Throat:  Ears:  no hearing loss Nose/Mouth/Throat:  no complaints of nasal congestion, no sore throat Cardiovascular:  no chest pain Respiratory:  no shortness of breath Gastrointestinal:  no change in bowel habits GU:  Male: negative for dysuria, frequency Musculoskeletal/Extremities:  no joint pain Integumentary (Skin/Breast):  no abnormal skin lesions reported Neurologic:  no headaches Endocrine: No unexpected weight changes Hematologic/Lymphatic:  no abnormal bleeding  Exam BP 120/80 (BP Location: Left Arm, Patient Position: Sitting, Cuff Size: Normal)   Pulse 68   Temp 97.7 F (36.5 C) (Oral)   Ht 5\' 9"  (1.753 m)   Wt 186 lb (84.4 kg)   SpO2 95%   BMI 27.47 kg/m  General:  well developed, well nourished, in no apparent distress Skin:  no significant moles, warts, or growths Head:  no masses, lesions, or tenderness Eyes:  pupils equal and round, sclera anicteric without injection Ears:  canals without lesions, TMs shiny without retraction, no obvious effusion, no erythema Nose:  nares patent, mucosa normal Throat/Pharynx:  lips  and gingiva without lesion; tongue and uvula midline; non-inflamed pharynx; no exudates or postnasal drainage Neck: neck supple without adenopathy, thyromegaly, or masses Cardiac: RRR, no bruits, no LE edema Lungs:  clear to auscultation, breath sounds equal bilaterally, no respiratory distress Abdomen: BS+, soft, non-tender, non-distended, no masses or organomegaly noted Rectal: Deferred Musculoskeletal:  symmetrical muscle groups noted without atrophy or deformity Neuro:  gait normal; deep tendon reflexes normal and symmetric Psych: well oriented with normal range of  affect and appropriate judgment/insight  Assessment and Plan  Well adult exam   Well 62 y.o. male. Counseled on diet and exercise. Counseled on risks and benefits of prostate cancer screening with PSA. The patient agreed to undergo testing. Advanced directive form requested today.  Immunizations, labs, and further orders as above. Follow up in 1 yr. The patient voiced understanding and agreement to the plan.  Jilda Roche [place], DO 09/02/22 9:12 AM

## 2022-10-07 ENCOUNTER — Encounter: Payer: Self-pay | Admitting: Family Medicine

## 2022-10-07 ENCOUNTER — Other Ambulatory Visit: Payer: Self-pay | Admitting: Family Medicine

## 2022-10-07 DIAGNOSIS — Z1283 Encounter for screening for malignant neoplasm of skin: Secondary | ICD-10-CM

## 2022-10-27 ENCOUNTER — Encounter: Payer: Self-pay | Admitting: Hematology & Oncology

## 2022-10-29 ENCOUNTER — Other Ambulatory Visit: Payer: Self-pay

## 2022-10-29 ENCOUNTER — Inpatient Hospital Stay

## 2022-10-29 ENCOUNTER — Inpatient Hospital Stay: Attending: Hematology & Oncology

## 2022-10-29 ENCOUNTER — Telehealth: Payer: Self-pay | Admitting: *Deleted

## 2022-10-29 VITALS — BP 113/66 | HR 62 | Temp 98.0°F | Resp 16

## 2022-10-29 DIAGNOSIS — D751 Secondary polycythemia: Secondary | ICD-10-CM

## 2022-10-29 LAB — CMP (CANCER CENTER ONLY)
ALT: 14 U/L (ref 0–44)
AST: 16 U/L (ref 15–41)
Albumin: 4.4 g/dL (ref 3.5–5.0)
Alkaline Phosphatase: 71 U/L (ref 38–126)
Anion gap: 6 (ref 5–15)
BUN: 10 mg/dL (ref 8–23)
CO2: 30 mmol/L (ref 22–32)
Calcium: 9.4 mg/dL (ref 8.9–10.3)
Chloride: 106 mmol/L (ref 98–111)
Creatinine: 0.88 mg/dL (ref 0.61–1.24)
GFR, Estimated: >60 mL/min (ref >60)
Glucose, Bld: 96 mg/dL (ref 70–99)
Potassium: 4 mmol/L (ref 3.5–5.1)
Sodium: 142 mmol/L (ref 135–145)
Total Bilirubin: 0.2 mg/dL — ABNORMAL LOW (ref 0.3–1.2)
Total Protein: 7 g/dL (ref 6.5–8.1)

## 2022-10-29 LAB — CBC WITH DIFFERENTIAL (CANCER CENTER ONLY)
Abs Immature Granulocytes: 0.01 10*3/uL (ref 0.00–0.07)
Basophils Absolute: 0 10*3/uL (ref 0.0–0.1)
Basophils Relative: 1 %
Eosinophils Absolute: 0.1 10*3/uL (ref 0.0–0.5)
Eosinophils Relative: 2 %
HCT: 46 % (ref 39.0–52.0)
Hemoglobin: 14.9 g/dL (ref 13.0–17.0)
Immature Granulocytes: 0 %
Lymphocytes Relative: 39 %
Lymphs Abs: 2.2 10*3/uL (ref 0.7–4.0)
MCH: 26.3 pg (ref 26.0–34.0)
MCHC: 32.4 g/dL (ref 30.0–36.0)
MCV: 81.1 fL (ref 80.0–100.0)
Monocytes Absolute: 0.6 10*3/uL (ref 0.1–1.0)
Monocytes Relative: 10 %
Neutro Abs: 2.7 10*3/uL (ref 1.7–7.7)
Neutrophils Relative %: 48 %
Platelet Count: 228 10*3/uL (ref 150–400)
RBC: 5.67 MIL/uL (ref 4.22–5.81)
RDW: 14.3 % (ref 11.5–15.5)
WBC Count: 5.6 10*3/uL (ref 4.0–10.5)
nRBC: 0 % (ref 0.0–0.2)

## 2022-10-29 NOTE — Patient Instructions (Signed)

## 2022-10-29 NOTE — Telephone Encounter (Signed)
Per staff message Steve Nolan - called patient to see if he would come in for lab only (iron studies) patient wishes to wait until he comes back in November.

## 2022-10-29 NOTE — Progress Notes (Signed)
Steve Nolan presents today for phlebotomy per MD orders. Phlebotomy procedure started at 0935 via 18 gauge angiocath and ended at 0950. 524 grams removed. Patient did not want to wait 30 min observation period.  VSS Tolerated procedure without any incident. Patient tolerated procedure well. IV needle removed intact.

## 2022-11-04 ENCOUNTER — Encounter: Payer: Self-pay | Admitting: Family Medicine

## 2022-12-03 ENCOUNTER — Other Ambulatory Visit: Payer: Self-pay

## 2022-12-03 ENCOUNTER — Encounter (HOSPITAL_COMMUNITY): Payer: Self-pay | Admitting: Emergency Medicine

## 2022-12-03 ENCOUNTER — Emergency Department (HOSPITAL_COMMUNITY)

## 2022-12-03 ENCOUNTER — Emergency Department (HOSPITAL_COMMUNITY)
Admission: EM | Admit: 2022-12-03 | Discharge: 2022-12-03 | Disposition: A | Discharge status: Home / Self Care | Attending: Emergency Medicine | Admitting: Emergency Medicine

## 2022-12-03 DIAGNOSIS — M542 Cervicalgia: Secondary | ICD-10-CM | POA: Insufficient documentation

## 2022-12-03 DIAGNOSIS — Y92410 Unspecified street and highway as the place of occurrence of the external cause: Secondary | ICD-10-CM | POA: Diagnosis not present

## 2022-12-03 DIAGNOSIS — R519 Headache, unspecified: Secondary | ICD-10-CM | POA: Insufficient documentation

## 2022-12-03 MED ORDER — IBUPROFEN 800 MG PO TABS: 800.0000 mg | ORAL_TABLET | Freq: Once | ORAL | Status: DC
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
## 2022-12-25 ENCOUNTER — Encounter: Payer: Self-pay | Admitting: Hematology & Oncology

## 2022-12-26 ENCOUNTER — Inpatient Hospital Stay: Attending: Hematology & Oncology

## 2022-12-26 ENCOUNTER — Inpatient Hospital Stay

## 2022-12-26 DIAGNOSIS — D751 Secondary polycythemia: Secondary | ICD-10-CM | POA: Insufficient documentation

## 2022-12-26 LAB — CBC WITH DIFFERENTIAL (CANCER CENTER ONLY)
Abs Immature Granulocytes: 0.01 10*3/uL (ref 0.00–0.07)
Basophils Absolute: 0 10*3/uL (ref 0.0–0.1)
Basophils Relative: 1 %
Eosinophils Absolute: 0.1 10*3/uL (ref 0.0–0.5)
Eosinophils Relative: 2 %
HCT: 44.9 % (ref 39.0–52.0)
Hemoglobin: 14.7 g/dL (ref 13.0–17.0)
Immature Granulocytes: 0 %
Lymphocytes Relative: 38 %
Lymphs Abs: 1.5 10*3/uL (ref 0.7–4.0)
MCH: 26.2 pg (ref 26.0–34.0)
MCHC: 32.7 g/dL (ref 30.0–36.0)
MCV: 79.9 fL — ABNORMAL LOW (ref 80.0–100.0)
Monocytes Absolute: 0.4 10*3/uL (ref 0.1–1.0)
Monocytes Relative: 10 %
Neutro Abs: 2 10*3/uL (ref 1.7–7.7)
Neutrophils Relative %: 49 %
Platelet Count: 257 10*3/uL (ref 150–400)
RBC: 5.62 MIL/uL (ref 4.22–5.81)
RDW: 14.4 % (ref 11.5–15.5)
WBC Count: 4.1 10*3/uL (ref 4.0–10.5)
nRBC: 0 % (ref 0.0–0.2)

## 2022-12-26 LAB — CMP (CANCER CENTER ONLY)
ALT: 40 U/L (ref 0–44)
AST: 34 U/L (ref 15–41)
Albumin: 4.2 g/dL (ref 3.5–5.0)
Alkaline Phosphatase: 66 U/L (ref 38–126)
Anion gap: 8 (ref 5–15)
BUN: 15 mg/dL (ref 8–23)
CO2: 28 mmol/L (ref 22–32)
Calcium: 9.4 mg/dL (ref 8.9–10.3)
Chloride: 104 mmol/L (ref 98–111)
Creatinine: 0.84 mg/dL (ref 0.61–1.24)
GFR, Estimated: >60 mL/min (ref >60)
Glucose, Bld: 146 mg/dL — ABNORMAL HIGH (ref 70–99)
Potassium: 3.9 mmol/L (ref 3.5–5.1)
Sodium: 140 mmol/L (ref 135–145)
Total Bilirubin: 0.5 mg/dL (ref 0.3–1.2)
Total Protein: 7 g/dL (ref 6.5–8.1)

## 2022-12-26 LAB — IRON AND IRON BINDING CAPACITY (CC-WL,HP ONLY)
Iron: 44 ug/dL — ABNORMAL LOW (ref 45–182)
Saturation Ratios: 9 % — ABNORMAL LOW (ref 17.9–39.5)
TIBC: 468 ug/dL — ABNORMAL HIGH (ref 250–450)
UIBC: 424 ug/dL — ABNORMAL HIGH (ref 117–376)

## 2022-12-26 LAB — FERRITIN: Ferritin: 15 ng/mL — ABNORMAL LOW (ref 24–336)

## 2022-12-26 NOTE — Patient Instructions (Signed)

## 2022-12-26 NOTE — Progress Notes (Signed)
Steve Nolan presents today for phlebotomy per MD orders. Phlebotomy procedure started at 0821 and ended at 0832. 519 grams removed from lt AC using 16g phlebotomy kit.  Patient declined post observation period.  Patient tolerated procedure well. IV needle removed intact.

## 2023-01-21 ENCOUNTER — Other Ambulatory Visit: Payer: Self-pay

## 2023-01-21 DIAGNOSIS — R109 Unspecified abdominal pain: Secondary | ICD-10-CM | POA: Diagnosis present

## 2023-01-21 DIAGNOSIS — N132 Hydronephrosis with renal and ureteral calculous obstruction: Secondary | ICD-10-CM | POA: Insufficient documentation

## 2023-01-21 MED ORDER — ONDANSETRON HCL 4 MG/2ML IJ SOLN
4.0000 mg | Freq: Once | INTRAMUSCULAR | Status: AC
  Administered 2023-01-22: 4 mg via INTRAVENOUS
  Filled 2023-01-21: qty 2

## 2023-01-21 MED ORDER — FENTANYL CITRATE PF 50 MCG/ML IJ SOSY
50.0000 ug | PREFILLED_SYRINGE | INTRAMUSCULAR | Status: AC | PRN
  Administered 2023-01-22 (×2): 50 ug via INTRAVENOUS
  Filled 2023-01-21 (×2): qty 1

## 2023-01-21 NOTE — ED Triage Notes (Signed)
Pt presents to ER with c/o right side flank pain that started yesterday night.  Pt states pain had gone away for a brief time and came back again tonight.  Pt states he believes it is a kidney stone.  Pt reports hx of kidney stones and previous lithotripsy procedure.  Pt denies any urinary sx at this time.  Pt otherwise A&O x4 and in NAD In triage.

## 2023-01-22 ENCOUNTER — Emergency Department

## 2023-01-22 ENCOUNTER — Other Ambulatory Visit: Payer: Self-pay

## 2023-01-22 ENCOUNTER — Emergency Department
Admission: EM | Admit: 2023-01-22 | Discharge: 2023-01-22 | Disposition: A | Discharge status: Home / Self Care | Attending: Emergency Medicine | Admitting: Emergency Medicine

## 2023-01-22 ENCOUNTER — Emergency Department
Admission: EM | Admit: 2023-01-22 | Discharge: 2023-01-22 | Disposition: A | Discharge status: Home / Self Care | Source: Home / Self Care | Attending: Emergency Medicine | Admitting: Emergency Medicine

## 2023-01-22 DIAGNOSIS — R109 Unspecified abdominal pain: Secondary | ICD-10-CM

## 2023-01-22 DIAGNOSIS — N200 Calculus of kidney: Secondary | ICD-10-CM | POA: Insufficient documentation

## 2023-01-22 DIAGNOSIS — N23 Unspecified renal colic: Secondary | ICD-10-CM

## 2023-01-22 LAB — COMPREHENSIVE METABOLIC PANEL
ALT: 24 U/L (ref 0–44)
AST: 29 U/L (ref 15–41)
Albumin: 4.5 g/dL (ref 3.5–5.0)
Alkaline Phosphatase: 75 U/L (ref 38–126)
Anion gap: 9 (ref 5–15)
BUN: 18 mg/dL (ref 8–23)
CO2: 26 mmol/L (ref 22–32)
Calcium: 8.8 mg/dL — ABNORMAL LOW (ref 8.9–10.3)
Chloride: 100 mmol/L (ref 98–111)
Creatinine, Ser: 0.87 mg/dL (ref 0.61–1.24)
GFR, Estimated: >60 mL/min (ref >60)
Glucose, Bld: 108 mg/dL — ABNORMAL HIGH (ref 70–99)
Potassium: 3.4 mmol/L — ABNORMAL LOW (ref 3.5–5.1)
Sodium: 135 mmol/L (ref 135–145)
Total Bilirubin: 0.6 mg/dL (ref 0.3–1.2)
Total Protein: 7.3 g/dL (ref 6.5–8.1)

## 2023-01-22 LAB — BASIC METABOLIC PANEL
Anion gap: 8 (ref 5–15)
BUN: 16 mg/dL (ref 8–23)
CO2: 26 mmol/L (ref 22–32)
Calcium: 8.7 mg/dL — ABNORMAL LOW (ref 8.9–10.3)
Chloride: 102 mmol/L (ref 98–111)
Creatinine, Ser: 0.98 mg/dL (ref 0.61–1.24)
GFR, Estimated: >60 mL/min (ref >60)
Glucose, Bld: 118 mg/dL — ABNORMAL HIGH (ref 70–99)
Potassium: 3.3 mmol/L — ABNORMAL LOW (ref 3.5–5.1)
Sodium: 136 mmol/L (ref 135–145)

## 2023-01-22 LAB — URINALYSIS, ROUTINE W REFLEX MICROSCOPIC
Bacteria, UA: NONE SEEN
Bilirubin Urine: NEGATIVE
Bilirubin Urine: NEGATIVE
Glucose, UA: NEGATIVE mg/dL
Glucose, UA: NEGATIVE mg/dL
Hgb urine dipstick: NEGATIVE
Ketones, ur: NEGATIVE mg/dL
Ketones, ur: NEGATIVE mg/dL
Leukocytes,Ua: NEGATIVE
Leukocytes,Ua: NEGATIVE
Nitrite: NEGATIVE
Nitrite: NEGATIVE
Protein, ur: NEGATIVE mg/dL
Protein, ur: NEGATIVE mg/dL
RBC / HPF: >50 RBC/hpf (ref 0–5)
Specific Gravity, Urine: 1.011 (ref 1.005–1.030)
Specific Gravity, Urine: 1.019 (ref 1.005–1.030)
pH: 6 (ref 5.0–8.0)
pH: 5 (ref 5.0–8.0)

## 2023-01-22 LAB — CBC
HCT: 40 % (ref 39.0–52.0)
HCT: 42.6 % (ref 39.0–52.0)
Hemoglobin: 12.9 g/dL — ABNORMAL LOW (ref 13.0–17.0)
Hemoglobin: 13.4 g/dL (ref 13.0–17.0)
MCH: 25.2 pg — ABNORMAL LOW (ref 26.0–34.0)
MCH: 25.3 pg — ABNORMAL LOW (ref 26.0–34.0)
MCHC: 31.5 g/dL (ref 30.0–36.0)
MCHC: 32.3 g/dL (ref 30.0–36.0)
MCV: 78.6 fL — ABNORMAL LOW (ref 80.0–100.0)
MCV: 80.1 fL (ref 80.0–100.0)
Platelets: 231 10*3/uL (ref 150–400)
Platelets: 256 10*3/uL (ref 150–400)
RBC: 5.09 MIL/uL (ref 4.22–5.81)
RBC: 5.32 MIL/uL (ref 4.22–5.81)
RDW: 13.6 % (ref 11.5–15.5)
RDW: 13.7 % (ref 11.5–15.5)
WBC: 12.6 10*3/uL — ABNORMAL HIGH (ref 4.0–10.5)
WBC: 9 10*3/uL (ref 4.0–10.5)
nRBC: 0 % (ref 0.0–0.2)
nRBC: 0 % (ref 0.0–0.2)

## 2023-01-22 MED ORDER — LACTATED RINGERS IV BOLUS
1000.0000 mL | Freq: Once | INTRAVENOUS | Status: AC
  Administered 2023-01-22: 1000 mL via INTRAVENOUS

## 2023-01-22 MED ORDER — KETOROLAC TROMETHAMINE 10 MG PO TABS: 10.0000 mg | ORAL_TABLET | Freq: Four times a day (QID) | ORAL | 0 refills | Status: DC | PRN

## 2023-01-22 MED ORDER — KETOROLAC TROMETHAMINE 15 MG/ML IJ SOLN
15.0000 mg | Freq: Once | INTRAMUSCULAR | Status: AC
  Administered 2023-01-22: 15 mg via INTRAVENOUS
  Filled 2023-01-22: qty 1

## 2023-01-22 MED ORDER — SODIUM CHLORIDE 0.9 % IV BOLUS
500.0000 mL | Freq: Once | INTRAVENOUS | Status: AC
  Administered 2023-01-22: 500 mL via INTRAVENOUS

## 2023-01-22 MED ORDER — TAMSULOSIN HCL 0.4 MG PO CAPS
0.4000 mg | ORAL_CAPSULE | Freq: Once | ORAL | Status: AC
  Administered 2023-01-22: 0.4 mg via ORAL
  Filled 2023-01-22: qty 1

## 2023-01-22 MED ORDER — ONDANSETRON HCL 4 MG/2ML IJ SOLN
4.0000 mg | Freq: Once | INTRAMUSCULAR | Status: AC
  Administered 2023-01-22: 4 mg via INTRAVENOUS
  Filled 2023-01-22: qty 2

## 2023-01-22 MED ORDER — KETOROLAC TROMETHAMINE 30 MG/ML IJ SOLN
15.0000 mg | Freq: Once | INTRAMUSCULAR | Status: AC
  Administered 2023-01-22: 15 mg via INTRAVENOUS
  Filled 2023-01-22: qty 1

## 2023-01-22 MED ORDER — OXYCODONE-ACETAMINOPHEN 5-325 MG PO TABS
1.0000 | ORAL_TABLET | ORAL | Status: DC | PRN
  Administered 2023-01-22: 1 via ORAL
  Filled 2023-01-22: qty 1

## 2023-01-22 NOTE — ED Provider Notes (Signed)
[facility] Provider Note    Event Date/Time   First MD Initiated Contact with Patient 01/22/23 0009     (approximate)   History   Flank Pain   HPI  Steve Nolan is a 62 y.o. male who presents to the ED for evaluation of Flank Pain   Patient with a history of kidney stones presents to the ED with severe right flank pain consistent with previous episodes of ureteral colic.  No preceding dysuria, fever or other concerns   Physical Exam   Triage Vital Signs: ED Triage Vitals  Encounter Vitals Group     BP 01/21/23 2355 (!) 162/89     Systolic BP Percentile --      Diastolic BP Percentile --      Pulse Rate 01/21/23 2355 73     Resp 01/21/23 2355 14     Temp 01/22/23 0103 98.8 F (37.1 C)     Temp Source 01/22/23 0103 Oral     SpO2 01/21/23 2355 100 %     Weight 01/21/23 2356 180 lb (81.6 kg)     Height 01/21/23 2356 5\' 9"  (1.753 m)     Head Circumference --      Peak Flow --      Pain Score 01/21/23 2356 9     Pain Loc --      Pain Education --      Exclude from Growth Chart --     Most recent vital signs: Vitals:   01/21/23 2355 01/22/23 0103  BP: (!) 162/89   Pulse: 73   Resp: 14   Temp:  98.8 F (37.1 C)  SpO2: 100%     General: Awake, no distress.  Clearly uncomfortable CV:  Good peripheral perfusion.  Resp:  Normal effort.  Abd:  No distention.  Soft MSK:  No deformity noted.  Neuro:  No focal deficits appreciated. Other:     ED Results / Procedures / Treatments   Labs (all labs ordered are listed, but only abnormal results are displayed) Labs Reviewed  CBC - Abnormal; Notable for the following components:      Result Value   MCH 25.2 (*)    All other components within normal limits  URINALYSIS, ROUTINE W REFLEX MICROSCOPIC - Abnormal; Notable for the following components:   Color, Urine YELLOW (*)    APPearance CLEAR (*)    Hgb urine dipstick LARGE (*)    All other components within normal limits   COMPREHENSIVE METABOLIC PANEL - Abnormal; Notable for the following components:   Potassium 3.4 (*)    Glucose, Bld 108 (*)    Calcium 8.8 (*)    All other components within normal limits    EKG   RADIOLOGY CT renal study interpreted by me with a small distal right ureteral stone  Official radiology report(s): CT Renal Stone Study  Result Date: 01/22/2023 CLINICAL DATA:  Right-sided flank pain.  History of kidney stones. EXAM: CT ABDOMEN AND PELVIS WITHOUT CONTRAST TECHNIQUE: Multidetector CT imaging of the abdomen and pelvis was performed following the standard protocol without IV contrast. RADIATION DOSE REDUCTION: This exam was performed according to the departmental dose-optimization program which includes automated exposure control, adjustment of the mA and/or kV according to patient size and/or use of iterative reconstruction technique. COMPARISON:  CT 01/03/2017 FINDINGS: Lower chest: No acute abnormality. Hepatobiliary: Unremarkable liver. Normal gallbladder. No biliary dilation. Pancreas: Unremarkable. Spleen: Unremarkable. Adrenals/Urinary Tract: Normal adrenal glands. Nonobstructing left nephrolithiasis. Mild right  hydroureteronephrosis upstream from a 2 mm stone at the right UVJ. Diffuse bladder wall thickening. Stomach/Bowel: Normal caliber large and small bowel. No bowel wall thickening. The appendix is normal.Stomach is within normal limits. Vascular/Lymphatic: No significant vascular findings are present. No enlarged abdominal or pelvic lymph nodes. Reproductive: Enlarged prostate. Other: No free intraperitoneal fluid or air. Musculoskeletal: No acute fracture. IMPRESSION: 1. Mild right hydroureteronephrosis upstream from a 2 mm stone at the right UVJ. 2. Nonobstructing left nephrolithiasis. 3. Diffuse bladder wall thickening, consider obstructive uropathy given enlarged prostate. Electronically Signed   By: Minerva Fester M.D.   On: 01/22/2023 03:31    PROCEDURES and  INTERVENTIONS:  Procedures  Medications  fentaNYL (SUBLIMAZE) injection 50 mcg (50 mcg Intravenous Given 01/22/23 0121)  ondansetron (ZOFRAN) injection 4 mg (4 mg Intravenous Given 01/22/23 0006)  lactated ringers bolus 1,000 mL (0 mLs Intravenous Stopped 01/22/23 0352)  ketorolac (TORADOL) 30 MG/ML injection 15 mg (15 mg Intravenous Given 01/22/23 0205)  tamsulosin (FLOMAX) capsule 0.4 mg (0.4 mg Oral Given 01/22/23 0204)     IMPRESSION / MDM / ASSESSMENT AND PLAN / ED COURSE  I reviewed the triage vital signs and the nursing notes.  Differential diagnosis includes, but is not limited to, ureteral colic, pyelonephritis, acute cystitis, urinary retention, appendicitis  {Patient presents with symptoms of an acute illness or injury that is potentially life-threatening.  Patient presents with typical symptoms of ureteral colic with a small distal ureteral stone and likely passing in the ED, suitable for outpatient management.  2 mm stone on imaging.  Urine without infectious features.  Normal CBC and metabolic panel.  Pain-free after Toradol and suitable for outpatient management.  Clinical Course as of 01/22/23 0429  Wed Jan 22, 2023  0205 Reassessed.  Still in quite a bit of pain despite the fentanyl.  He is requesting Toradol.  We discussed the possibility of this precluding or delaying lithotripsy.  He acknowledges this and would still like this medication [DS]  0427 Reassessed, pain-free.  We discussed kidney stones and workup overall. [DS]    Clinical Course User Index [DS] Delton Prairie, MD     FINAL CLINICAL IMPRESSION(S) / ED DIAGNOSES   Final diagnoses:  Ureteral colic     Rx / DC Orders   ED Discharge Orders     None        Note:  This document was prepared using Dragon voice recognition software and may include unintentional dictation errors.   Delton Prairie, MD 01/22/23 [phone]

## 2023-01-22 NOTE — Discharge Instructions (Signed)
Please call your urologist to set up follow-up for ongoing treatment.

## 2023-01-22 NOTE — ED Triage Notes (Signed)
Pt presents to ER with c/o right side flank pain that started 1.5-2 hrs ago.  Pt seen here yesterday for same and was able to be discharged with pain controlled.  Pt states pain is about the same as it was on arrival yesterday.  Pt is otherwise A&O x4, but appears uncomfortable in triage.

## 2023-01-22 NOTE — ED Provider Notes (Signed)
[facility] Provider Note    Event Date/Time   First MD Initiated Contact with Patient 01/22/23 2056     (approximate)   History   Flank Pain   HPI Steve Nolan is a 62 y.o. male with prior history of kidney stones presenting today for flank pain.  Patient was just seen in the emergency department last night for similar pain was found to have a 2 mm stone in the right UVJ.  Pain was successfully controlled and he was able to be discharged.  Noted recurrence of pain symptoms tonight.  Pain is in the same spot and feels similar to last night.  Has had some nausea and vomiting associated with this pain.  Otherwise denies any new abdominal pain, fevers, dysuria, hematuria.  Reportedly has a history of needing lithotripsy in the past.  Reviewed ED chart notes from yesterday.     Physical Exam   Triage Vital Signs: ED Triage Vitals [01/22/23 1933]  Encounter Vitals Group     BP (!) 162/80     Systolic BP Percentile      Diastolic BP Percentile      Pulse Rate 72     Resp 20     Temp 97.8 F (36.6 C)     Temp Source Oral     SpO2 100 %     Weight      Height      Head Circumference      Peak Flow      Pain Score 9     Pain Loc      Pain Education      Exclude from Growth Chart     Most recent vital signs: Vitals:   01/22/23 1933  BP: (!) 162/80  Pulse: 72  Resp: 20  Temp: 97.8 F (36.6 C)  SpO2: 100%   I have reviewed the vital signs. General:  Awake, alert, uncomfortable appearing Head:  Normocephalic, Atraumatic. EENT:  PERRL, EOMI, Oral mucosa pink and moist, Neck is supple. Cardiovascular: Regular rate, 2+ distal pulses. Respiratory:  Normal respiratory effort, symmetrical expansion, no distress.   Extremities:  Moving all four extremities through full ROM without pain.   Abdomen: No tenderness palpation throughout abdomen, soft, nondistended.  Right-sided CVA tenderness palpation. Neuro:  Alert and oriented.  Interacting  appropriately.   Skin:  Warm, dry, no rash.   Psych: Appropriate affect.     ED Results / Procedures / Treatments   Labs (all labs ordered are listed, but only abnormal results are displayed) Labs Reviewed  CBC - Abnormal; Notable for the following components:      Result Value   WBC 12.6 (*)    Hemoglobin 12.9 (*)    MCV 78.6 (*)    MCH 25.3 (*)    All other components within normal limits  BASIC METABOLIC PANEL - Abnormal; Notable for the following components:   Potassium 3.3 (*)    Glucose, Bld 118 (*)    Calcium 8.7 (*)    All other components within normal limits  URINALYSIS, ROUTINE W REFLEX MICROSCOPIC - Abnormal; Notable for the following components:   Color, Urine YELLOW (*)    APPearance CLEAR (*)    All other components within normal limits     EKG    RADIOLOGY    PROCEDURES:  Critical Care performed: No  Procedures   MEDICATIONS ORDERED IN ED: Medications  oxyCODONE-acetaminophen (PERCOCET/ROXICET) 5-325 MG per tablet 1 tablet (1 tablet Oral Given  01/22/23 1937)  ketorolac (TORADOL) 15 MG/ML injection 15 mg (15 mg Intravenous Given 01/22/23 2131)  ondansetron (ZOFRAN) injection 4 mg (4 mg Intravenous Given 01/22/23 2132)  sodium chloride 0.9 % bolus 500 mL (0 mLs Intravenous Stopped 01/22/23 2203)     IMPRESSION / MDM / ASSESSMENT AND PLAN / ED COURSE  I reviewed the triage vital signs and the nursing notes.                              Differential diagnosis includes, but is not limited to, nephrolithiasis, complicated by cystitis  Patient's presentation is most consistent with acute complicated illness / injury requiring diagnostic workup.  Patient is a 62 year old male presenting today for the second time in 24 hours for right flank pain.  Seen in the ED yesterday and found to have nephrolithiasis.  Was able to be discharged after pain control and nausea control.  Presented again today for worsening flank pain.  No other new symptoms  associated with it no fevers or chills.  Laboratory workup with mild leukocytosis but otherwise reassuring.  No evidence of UTI.  Patient was given a Toradol, Zofran, and fluids with complete resolution of symptoms.  He did feel he wanted to go home at this time to call his urology team to set up follow-up appointment.  I am agreeable with this plan as he is stable and safe for discharge.  Given strict return precautions for worsening symptoms.  The patient is on the cardiac monitor to evaluate for evidence of arrhythmia and/or significant heart rate changes. Clinical Course as of 01/22/23 2305  Wed Jan 22, 2023  2226 Reassessed patient.  Is feeling significantly better at this time.  Will reassess in 30 minutes for potential discharge if still feeling well. [DW]  2301 Patient still feeling well and safer discharge at this time [DW]    Clinical Course User Index [DW] Janith Lima, MD     FINAL CLINICAL IMPRESSION(S) / ED DIAGNOSES   Final diagnoses:  Nephrolithiasis  Right flank pain     Rx / DC Orders   ED Discharge Orders          Ordered    ketorolac (TORADOL) 10 MG tablet  Every 6 hours PRN        01/22/23 2302    Ambulatory referral to Urology       Comments: Nephrolithiasis, history of prior lithotripsy   01/22/23 2303             Note:  This document was prepared using Dragon voice recognition software and may include unintentional dictation errors.   Janith Lima, MD 01/22/23 [phone]

## 2023-02-03 ENCOUNTER — Other Ambulatory Visit: Payer: Self-pay | Admitting: *Deleted

## 2023-02-04 ENCOUNTER — Other Ambulatory Visit: Payer: Self-pay

## 2023-02-04 ENCOUNTER — Inpatient Hospital Stay

## 2023-02-04 ENCOUNTER — Inpatient Hospital Stay: Attending: Hematology & Oncology

## 2023-02-04 ENCOUNTER — Encounter: Payer: Self-pay | Admitting: Hematology & Oncology

## 2023-02-04 ENCOUNTER — Inpatient Hospital Stay (HOSPITAL_BASED_OUTPATIENT_CLINIC_OR_DEPARTMENT_OTHER): Admitting: Hematology & Oncology

## 2023-02-04 VITALS — BP 122/81 | HR 69 | Temp 97.8°F | Resp 17 | Ht 69.0 in | Wt 180.0 lb

## 2023-02-04 DIAGNOSIS — N200 Calculus of kidney: Secondary | ICD-10-CM | POA: Insufficient documentation

## 2023-02-04 DIAGNOSIS — D751 Secondary polycythemia: Secondary | ICD-10-CM | POA: Insufficient documentation

## 2023-02-04 DIAGNOSIS — Z0289 Encounter for other administrative examinations: Secondary | ICD-10-CM | POA: Insufficient documentation

## 2023-02-04 LAB — CBC WITH DIFFERENTIAL (CANCER CENTER ONLY)
Abs Immature Granulocytes: 0.01 10*3/uL (ref 0.00–0.07)
Basophils Absolute: 0 10*3/uL (ref 0.0–0.1)
Basophils Relative: 0 %
Eosinophils Absolute: 0.1 10*3/uL (ref 0.0–0.5)
Eosinophils Relative: 2 %
HCT: 41.9 % (ref 39.0–52.0)
Hemoglobin: 13.4 g/dL (ref 13.0–17.0)
Immature Granulocytes: 0 %
Lymphocytes Relative: 19 %
Lymphs Abs: 1.2 10*3/uL (ref 0.7–4.0)
MCH: 25.7 pg — ABNORMAL LOW (ref 26.0–34.0)
MCHC: 32 g/dL (ref 30.0–36.0)
MCV: 80.3 fL (ref 80.0–100.0)
Monocytes Absolute: 0.5 10*3/uL (ref 0.1–1.0)
Monocytes Relative: 9 %
Neutro Abs: 4.4 10*3/uL (ref 1.7–7.7)
Neutrophils Relative %: 70 %
Platelet Count: 279 10*3/uL (ref 150–400)
RBC: 5.22 MIL/uL (ref 4.22–5.81)
RDW: 13.7 % (ref 11.5–15.5)
WBC Count: 6.3 10*3/uL (ref 4.0–10.5)
nRBC: 0 % (ref 0.0–0.2)

## 2023-02-04 LAB — IRON AND IRON BINDING CAPACITY (CC-WL,HP ONLY)
Iron: 28 ug/dL — ABNORMAL LOW (ref 45–182)
Saturation Ratios: 6 % — ABNORMAL LOW (ref 17.9–39.5)
TIBC: 473 ug/dL — ABNORMAL HIGH (ref 250–450)
UIBC: 445 ug/dL — ABNORMAL HIGH (ref 117–376)

## 2023-02-04 LAB — FERRITIN: Ferritin: 12 ng/mL — ABNORMAL LOW (ref 24–336)
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
## 2023-02-05 ENCOUNTER — Encounter: Payer: Self-pay | Admitting: *Deleted

## 2023-03-16 ENCOUNTER — Encounter: Payer: Self-pay | Admitting: Hematology & Oncology

## 2023-03-18 ENCOUNTER — Other Ambulatory Visit: Payer: Self-pay

## 2023-03-18 DIAGNOSIS — D751 Secondary polycythemia: Secondary | ICD-10-CM

## 2023-03-20 ENCOUNTER — Inpatient Hospital Stay

## 2023-03-20 ENCOUNTER — Inpatient Hospital Stay: Attending: Hematology & Oncology

## 2023-03-20 VITALS — BP 111/71 | HR 70

## 2023-03-20 DIAGNOSIS — D751 Secondary polycythemia: Secondary | ICD-10-CM | POA: Insufficient documentation

## 2023-03-20 DIAGNOSIS — Z79899 Other long term (current) drug therapy: Secondary | ICD-10-CM | POA: Insufficient documentation

## 2023-03-20 LAB — CMP (CANCER CENTER ONLY)
ALT: 14 U/L (ref 0–44)
AST: 19 U/L (ref 15–41)
Albumin: 4.3 g/dL (ref 3.5–5.0)
Alkaline Phosphatase: 73 U/L (ref 38–126)
Anion gap: 6 (ref 5–15)
BUN: 14 mg/dL (ref 8–23)
CO2: 31 mmol/L (ref 22–32)
Calcium: 9.4 mg/dL (ref 8.9–10.3)
Chloride: 103 mmol/L (ref 98–111)
Creatinine: 0.9 mg/dL (ref 0.61–1.24)
GFR, Estimated: >60 mL/min (ref >60)
Glucose, Bld: 83 mg/dL (ref 70–99)
Potassium: 4.2 mmol/L (ref 3.5–5.1)
Sodium: 140 mmol/L (ref 135–145)
Total Bilirubin: 0.4 mg/dL (ref <1.2)
Total Protein: 6.7 g/dL (ref 6.5–8.1)

## 2023-03-20 LAB — FERRITIN: Ferritin: 11 ng/mL — ABNORMAL LOW (ref 24–336)

## 2023-03-20 LAB — CBC WITH DIFFERENTIAL (CANCER CENTER ONLY)
Abs Immature Granulocytes: 0.02 10*3/uL (ref 0.00–0.07)
Basophils Absolute: 0 10*3/uL (ref 0.0–0.1)
Basophils Relative: 0 %
Eosinophils Absolute: 0.1 10*3/uL (ref 0.0–0.5)
Eosinophils Relative: 2 %
HCT: 44.9 % (ref 39.0–52.0)
Hemoglobin: 14.5 g/dL (ref 13.0–17.0)
Immature Granulocytes: 0 %
Lymphocytes Relative: 28 %
Lymphs Abs: 2.4 10*3/uL (ref 0.7–4.0)
MCH: 25.3 pg — ABNORMAL LOW (ref 26.0–34.0)
MCHC: 32.3 g/dL (ref 30.0–36.0)
MCV: 78.2 fL — ABNORMAL LOW (ref 80.0–100.0)
Monocytes Absolute: 0.7 10*3/uL (ref 0.1–1.0)
Monocytes Relative: 8 %
Neutro Abs: 5.4 10*3/uL (ref 1.7–7.7)
Neutrophils Relative %: 62 %
Platelet Count: 245 10*3/uL (ref 150–400)
RBC: 5.74 MIL/uL (ref 4.22–5.81)
RDW: 14.6 % (ref 11.5–15.5)
WBC Count: 8.7 10*3/uL (ref 4.0–10.5)
nRBC: 0 % (ref 0.0–0.2)

## 2023-03-20 LAB — IRON AND IRON BINDING CAPACITY (CC-WL,HP ONLY)
Iron: 88 ug/dL (ref 45–182)
Saturation Ratios: 18 % (ref 17.9–39.5)
TIBC: 501 ug/dL — ABNORMAL HIGH (ref 250–450)
UIBC: 413 ug/dL — ABNORMAL HIGH (ref 117–376)

## 2023-03-20 NOTE — Progress Notes (Signed)
Steve Nolan presents today for phlebotomy per MD orders. Phlebotomy procedure started at 1112 and ended at 1118. 500 grams removed. Patient refused to be observed for 30 minutes after procedure . Patient tolerated procedure well. IV needle removed intact. VS pt discharged

## 2023-03-20 NOTE — Patient Instructions (Signed)

## 2023-03-25 ENCOUNTER — Ambulatory Visit: Admitting: Family Medicine

## 2023-03-25 ENCOUNTER — Encounter: Payer: Self-pay | Admitting: Family Medicine

## 2023-03-25 VITALS — BP 128/78 | HR 90 | Temp 98.0°F | Resp 16 | Ht 69.0 in | Wt 191.2 lb

## 2023-03-25 DIAGNOSIS — M25531 Pain in right wrist: Secondary | ICD-10-CM | POA: Diagnosis not present

## 2023-03-25 NOTE — Patient Instructions (Addendum)
 Heat (pad or rice pillow in microwave) over affected area, 10-15 minutes twice daily.   Wear the splint at night and during aggravating activities.   Send me a message in around 4 weeks if not improving.   Let us  know if you need anything.  Wrist and Forearm Exercises Do exercises exactly as told by your health care provider and adjust them as directed. It is normal to feel mild stretching, pulling, tightness, or discomfort as you do these exercises, but you should stop right away if you feel sudden pain or your pain gets worse.   RANGE OF MOTION EXERCISES These exercises warm up your muscles and joints and improve the movement and flexibility of your injured wrist and forearm. These exercises also help to relieve pain, numbness, and tingling. These exercises are done using the muscles in your injured wrist and forearm. Exercise A: Wrist Flexion, Active With your fingers relaxed, bend your wrist forward as far as you can. Hold this position for 30 seconds. Repeat 2 times. Complete this exercise 3 times per week. Exercise B: Wrist Extension, Active With your fingers relaxed, bend your wrist backward as far as you can. Hold this position for 30 seconds. Repeat 2 times. Complete this exercise 3 times per week. Exercise C: Supination, Active  Stand or sit with your arms at your sides. Bend your left / right elbow to an L shape (90 degrees). Turn your palm upward until you feel a gentle stretch on the inside of your forearm. Hold this position for 30 seconds. Slowly return your palm to the starting position. Repeat 2 times. Complete this exercise 3 times per week. Exercise D: Pronation, Active  Stand or sit with your arms at your sides. Bend your left / right elbow to an L shape (90 degrees). Turn your palm downward until you feel a gentle stretch on the top of your forearm. Hold this position for 30 seconds. Slowly return your palm to the starting position. Repeat 2 times.  Complete this exercise once a day.  STRETCHING EXERCISES These exercises warm up your muscles and joints and improve the movement and flexibility of your injured wrist and forearm. These exercises also help to relieve pain, numbness, and tingling. These exercises are done using your healthy wrist and forearm to help stretch the muscles in your injured wrist and forearm. Exercise E: Wrist Flexion, Passive  Extend your left / right arm in front of you, relax your wrist, and point your fingers downward. Gently push on the back of your hand. Stop when you feel a gentle stretch on the top of your forearm. Hold this position for 30 seconds. Repeat 2 times. Complete this exercise 3 times per week. Exercise F: Wrist Extension, Passive  Extend your left / right arm in front of you and turn your palm upward. Gently pull your palm and fingertips back so your fingers point downward. You should feel a gentle stretch on the palm-side of your forearm. Hold this position for 30 seconds. Repeat 2 times. Complete this exercise 3 times per week. Exercise G: Forearm Rotation, Supination, Passive Sit with your left / right elbow bent to an L shape (90 degrees) with your forearm resting on a table. Keeping your upper body and shoulder still, use your other hand to rotate your forearm palm-up until you feel a gentle to moderate stretch. Hold this position for 30 seconds. Slowly release the stretch and return to the starting position. Repeat 2 times. Complete this exercise 3 times per  week. Exercise H: Forearm Rotation, Pronation, Passive Sit with your left / right elbow bent to an L shape (90 degrees) with your forearm resting on a table. Keeping your upper body and shoulder still, use your other hand to rotate your forearm palm-down until you feel a gentle to moderate stretch. Hold this position for 30 seconds. Slowly release the stretch and return to the starting position. Repeat 2 times. Complete this  exercise 3 times per week.  STRENGTHENING EXERCISES These exercises build strength and endurance in your wrist and forearm. Endurance is the ability to use your muscles for a long time, even after they get tired. Exercise I: Wrist Flexors  Sit with your left / right forearm supported on a table and your hand resting palm-up over the edge of the table. Your elbow should be bent to an L shape (about 90 degrees) and be below the level of your shoulder. Hold a 3-5 lb weight in your left / right hand. Or, hold a rubber exercise band or tube in both hands, keeping your hands at the same level and hip distance apart. There should be a slight tension in the exercise band or tube. Slowly curl your hand up toward your forearm. Hold this position for 3 seconds. Slowly lower your hand back to the starting position. Repeat 2 times. Complete this exercise 3 times per week. Exercise J: Wrist Extensors  Sit with your left / right forearm supported on a table and your hand resting palm-down over the edge of the table. Your elbow should be bent to an L shape (about 90 degrees) and be below the level of your shoulder. Hold a 3-5 lb weight in your left / right hand. Or, hold a rubber exercise band or tube in both hands, keeping your hands at the same level and hip distance apart. There should be a slight tension in the exercise band or tube. Slowly curl your hand up toward your forearm. Hold this position for 3 seconds. Slowly lower your hand back to the starting position. Repeat 2 times. Complete this exercise 3 times per week. Exercise K: Forearm Rotation, Supination  Sit with your left / right forearm supported on a table and your hand resting palm-down. Your elbow should be at your side, bent to an L shape (about 90 degrees), and below the level of your shoulder. Keep your wrist stable and in a neutral position throughout the exercise. Gently hold a lightweight hammer with your left / right  hand. Without moving your elbow or wrist, slowly rotate your palm upward to a thumbs-up position. Hold this position for 3 seconds. Slowly return your forearm to the starting position. Repeat 2 times. Complete this exercise 3 times per week. Exercise L: Forearm Rotation, Pronation  Sit with your left / right forearm supported on a table and your hand resting palm-up. Your elbow should be at your side, bent to an L shape (about 90 degrees), and below the level of your shoulder. Keep your wrist stable. Do not allow it to move backward or forward during the exercise. Gently hold a lightweight hammer with your left / right hand. Without moving your elbow or wrist, slowly rotate your palm and hand upward to a thumbs-up position. Hold this position for 3 seconds. Slowly return your forearm to the starting position. Repeat 2 times. Complete this exercise 3 times per week. Exercise M: Grip Strengthening  Hold one of these items in your left / right hand: play dough, therapy putty, a  dense sponge, a stress ball, or a large, rolled sock. Squeeze as hard as you can without increasing pain. Hold this position for 5 seconds. Slowly release your grip. Repeat 2 times. Complete this exercise 3 times per week.  This information is not intended to replace advice given to you by your health care provider. Make sure you discuss any questions you have with your health care provider. Document Released: 01/23/2005 Document Revised: 12/04/2015 Document Reviewed: 12/04/2014 Elsevier Interactive Patient Education  [employer].
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
## 2023-04-28 ENCOUNTER — Encounter: Payer: Self-pay | Admitting: Hematology & Oncology

## 2023-05-20 ENCOUNTER — Encounter: Payer: Self-pay | Admitting: Hematology & Oncology

## 2023-05-22 ENCOUNTER — Other Ambulatory Visit: Payer: Self-pay | Admitting: *Deleted

## 2023-05-22 DIAGNOSIS — D751 Secondary polycythemia: Secondary | ICD-10-CM

## 2023-05-23 ENCOUNTER — Encounter: Payer: Self-pay | Admitting: Hematology & Oncology

## 2023-05-23 ENCOUNTER — Inpatient Hospital Stay

## 2023-05-23 ENCOUNTER — Inpatient Hospital Stay: Attending: Hematology & Oncology

## 2023-05-23 DIAGNOSIS — D751 Secondary polycythemia: Secondary | ICD-10-CM | POA: Insufficient documentation

## 2023-05-23 LAB — CBC WITH DIFFERENTIAL (CANCER CENTER ONLY)
Abs Immature Granulocytes: 0.01 10*3/uL (ref 0.00–0.07)
Basophils Absolute: 0 10*3/uL (ref 0.0–0.1)
Basophils Relative: 1 %
Eosinophils Absolute: 0.2 10*3/uL (ref 0.0–0.5)
Eosinophils Relative: 3 %
HCT: 44 % (ref 39.0–52.0)
Hemoglobin: 13.9 g/dL (ref 13.0–17.0)
Immature Granulocytes: 0 %
Lymphocytes Relative: 35 %
Lymphs Abs: 2.4 10*3/uL (ref 0.7–4.0)
MCH: 24.9 pg — ABNORMAL LOW (ref 26.0–34.0)
MCHC: 31.6 g/dL (ref 30.0–36.0)
MCV: 78.9 fL — ABNORMAL LOW (ref 80.0–100.0)
Monocytes Absolute: 0.6 10*3/uL (ref 0.1–1.0)
Monocytes Relative: 10 %
Neutro Abs: 3.4 10*3/uL (ref 1.7–7.7)
Neutrophils Relative %: 51 %
Platelet Count: 252 10*3/uL (ref 150–400)
RBC: 5.58 MIL/uL (ref 4.22–5.81)
RDW: 15.4 % (ref 11.5–15.5)
WBC Count: 6.7 10*3/uL (ref 4.0–10.5)
nRBC: 0 % (ref 0.0–0.2)

## 2023-05-23 LAB — CMP (CANCER CENTER ONLY)
ALT: 19 U/L (ref 0–44)
AST: 25 U/L (ref 15–41)
Albumin: 3.8 g/dL (ref 3.5–5.0)
Alkaline Phosphatase: 69 U/L (ref 38–126)
Anion gap: 8 (ref 5–15)
BUN: 11 mg/dL (ref 8–23)
CO2: 27 mmol/L (ref 22–32)
Calcium: 8.7 mg/dL — ABNORMAL LOW (ref 8.9–10.3)
Chloride: 103 mmol/L (ref 98–111)
Creatinine: 0.7 mg/dL (ref 0.61–1.24)
GFR, Estimated: >60 mL/min (ref >60)
Glucose, Bld: 102 mg/dL — ABNORMAL HIGH (ref 70–99)
Potassium: 3.7 mmol/L (ref 3.5–5.1)
Sodium: 138 mmol/L (ref 135–145)
Total Bilirubin: 0.4 mg/dL (ref 0.0–1.2)
Total Protein: 6.9 g/dL (ref 6.5–8.1)

## 2023-05-23 NOTE — Progress Notes (Signed)
 No phlebotomy HCT 44. Gave pt copyt of labs in waiting room

## 2023-07-07 ENCOUNTER — Encounter: Payer: Self-pay | Admitting: Hematology & Oncology

## 2023-07-08 ENCOUNTER — Other Ambulatory Visit: Payer: Self-pay

## 2023-07-08 ENCOUNTER — Inpatient Hospital Stay: Attending: Hematology & Oncology

## 2023-07-08 ENCOUNTER — Inpatient Hospital Stay

## 2023-07-08 VITALS — BP 101/69 | HR 69 | Temp 97.7°F | Resp 17

## 2023-07-08 DIAGNOSIS — D751 Secondary polycythemia: Secondary | ICD-10-CM

## 2023-07-08 LAB — CBC WITH DIFFERENTIAL (CANCER CENTER ONLY)
Abs Immature Granulocytes: 0.02 10*3/uL (ref 0.00–0.07)
Basophils Absolute: 0 10*3/uL (ref 0.0–0.1)
Basophils Relative: 1 %
Eosinophils Absolute: 0.1 10*3/uL (ref 0.0–0.5)
Eosinophils Relative: 2 %
HCT: 46 % (ref 39.0–52.0)
Hemoglobin: 14.8 g/dL (ref 13.0–17.0)
Immature Granulocytes: 0 %
Lymphocytes Relative: 39 %
Lymphs Abs: 2.3 10*3/uL (ref 0.7–4.0)
MCH: 25.4 pg — ABNORMAL LOW (ref 26.0–34.0)
MCHC: 32.2 g/dL (ref 30.0–36.0)
MCV: 78.9 fL — ABNORMAL LOW (ref 80.0–100.0)
Monocytes Absolute: 0.5 10*3/uL (ref 0.1–1.0)
Monocytes Relative: 9 %
Neutro Abs: 2.8 10*3/uL (ref 1.7–7.7)
Neutrophils Relative %: 49 %
Platelet Count: 264 10*3/uL (ref 150–400)
RBC: 5.83 MIL/uL — ABNORMAL HIGH (ref 4.22–5.81)
RDW: 14.9 % (ref 11.5–15.5)
WBC Count: 5.7 10*3/uL (ref 4.0–10.5)
nRBC: 0 % (ref 0.0–0.2)

## 2023-07-08 LAB — CMP (CANCER CENTER ONLY)
ALT: 20 U/L (ref 0–44)
AST: 24 U/L (ref 15–41)
Albumin: 4.3 g/dL (ref 3.5–5.0)
Alkaline Phosphatase: 68 U/L (ref 38–126)
Anion gap: 8 (ref 5–15)
BUN: 11 mg/dL (ref 8–23)
CO2: 27 mmol/L (ref 22–32)
Calcium: 9.2 mg/dL (ref 8.9–10.3)
Chloride: 103 mmol/L (ref 98–111)
Creatinine: 0.89 mg/dL (ref 0.61–1.24)
GFR, Estimated: >60 mL/min (ref >60)
Glucose, Bld: 78 mg/dL (ref 70–99)
Potassium: 3.8 mmol/L (ref 3.5–5.1)
Sodium: 138 mmol/L (ref 135–145)
Total Bilirubin: 0.5 mg/dL (ref 0.0–1.2)
Total Protein: 6.9 g/dL (ref 6.5–8.1)

## 2023-07-08 NOTE — Progress Notes (Signed)
 Steve Nolan presents today for phlebotomy per MD orders. Phlebotomy procedure started at 0923 and ended at 0933. 520 grams removed via 16 gauge needle to left AC using phlebotomy kit. Pt declined to stay for post procedure observation period. Pt stated he has tolerated procedure multiple times prior without difficulty. Pt aware to call clinic with any questions or concerns. Pt verbalized understanding and had no further questions.  Pt declined any snacks or drink. Pt states he ate a full breakfast before arrival for procedure. IV needle removed intact.

## 2023-07-08 NOTE — Patient Instructions (Signed)

## 2023-08-01 ENCOUNTER — Other Ambulatory Visit: Payer: Self-pay

## 2023-08-01 DIAGNOSIS — D751 Secondary polycythemia: Secondary | ICD-10-CM

## 2023-08-04 ENCOUNTER — Inpatient Hospital Stay

## 2023-08-04 ENCOUNTER — Inpatient Hospital Stay: Payer: Medicare Other | Attending: Hematology & Oncology

## 2023-08-04 ENCOUNTER — Inpatient Hospital Stay (HOSPITAL_BASED_OUTPATIENT_CLINIC_OR_DEPARTMENT_OTHER): Admitting: Medical Oncology

## 2023-08-04 ENCOUNTER — Encounter: Payer: Self-pay | Admitting: Medical Oncology

## 2023-08-04 VITALS — BP 112/76 | HR 67 | Resp 17

## 2023-08-04 DIAGNOSIS — D751 Secondary polycythemia: Secondary | ICD-10-CM | POA: Diagnosis present

## 2023-08-04 DIAGNOSIS — R1031 Right lower quadrant pain: Secondary | ICD-10-CM

## 2023-08-04 DIAGNOSIS — R634 Abnormal weight loss: Secondary | ICD-10-CM

## 2023-08-04 DIAGNOSIS — Z807 Family history of other malignant neoplasms of lymphoid, hematopoietic and related tissues: Secondary | ICD-10-CM | POA: Diagnosis not present

## 2023-08-04 DIAGNOSIS — Z79899 Other long term (current) drug therapy: Secondary | ICD-10-CM | POA: Insufficient documentation

## 2023-08-04 LAB — CMP (CANCER CENTER ONLY)
ALT: 18 U/L (ref 0–44)
AST: 21 U/L (ref 15–41)
Albumin: 4.6 g/dL (ref 3.5–5.0)
Alkaline Phosphatase: 69 U/L (ref 38–126)
Anion gap: 7 (ref 5–15)
BUN: 12 mg/dL (ref 8–23)
CO2: 29 mmol/L (ref 22–32)
Calcium: 9.4 mg/dL (ref 8.9–10.3)
Chloride: 104 mmol/L (ref 98–111)
Creatinine: 0.93 mg/dL (ref 0.61–1.24)
GFR, Estimated: >60 mL/min (ref >60)
Glucose, Bld: 79 mg/dL (ref 70–99)
Potassium: 4.1 mmol/L (ref 3.5–5.1)
Sodium: 140 mmol/L (ref 135–145)
Total Bilirubin: 0.4 mg/dL (ref 0.0–1.2)
Total Protein: 7.2 g/dL (ref 6.5–8.1)

## 2023-08-04 LAB — RETICULOCYTES
Immature Retic Fract: 11.7 % (ref 2.3–15.9)
RBC.: 5.59 MIL/uL (ref 4.22–5.81)
Retic Count, Absolute: 55.3 10*3/uL (ref 19.0–186.0)
Retic Ct Pct: 1 % (ref 0.4–3.1)

## 2023-08-04 LAB — CBC WITH DIFFERENTIAL (CANCER CENTER ONLY)
Abs Immature Granulocytes: 0.04 10*3/uL (ref 0.00–0.07)
Basophils Absolute: 0 10*3/uL (ref 0.0–0.1)
Basophils Relative: 1 %
Eosinophils Absolute: 0.1 10*3/uL (ref 0.0–0.5)
Eosinophils Relative: 2 %
HCT: 45.1 % (ref 39.0–52.0)
Hemoglobin: 14.2 g/dL (ref 13.0–17.0)
Immature Granulocytes: 1 %
Lymphocytes Relative: 36 %
Lymphs Abs: 2 10*3/uL (ref 0.7–4.0)
MCH: 25 pg — ABNORMAL LOW (ref 26.0–34.0)
MCHC: 31.5 g/dL (ref 30.0–36.0)
MCV: 79.4 fL — ABNORMAL LOW (ref 80.0–100.0)
Monocytes Absolute: 0.5 10*3/uL (ref 0.1–1.0)
Monocytes Relative: 9 %
Neutro Abs: 2.8 10*3/uL (ref 1.7–7.7)
Neutrophils Relative %: 51 %
Platelet Count: 258 10*3/uL (ref 150–400)
RBC: 5.68 MIL/uL (ref 4.22–5.81)
RDW: 14.6 % (ref 11.5–15.5)
WBC Count: 5.4 10*3/uL (ref 4.0–10.5)
nRBC: 0 % (ref 0.0–0.2)

## 2023-08-04 LAB — IRON AND IRON BINDING CAPACITY (CC-WL,HP ONLY)
Iron: 74 ug/dL (ref 45–182)
Saturation Ratios: 15 % — ABNORMAL LOW (ref 17.9–39.5)
TIBC: 490 ug/dL — ABNORMAL HIGH (ref 250–450)
UIBC: 416 ug/dL — ABNORMAL HIGH (ref 117–376)

## 2023-08-04 LAB — FERRITIN: Ferritin: 13 ng/mL — ABNORMAL LOW (ref 24–336)

## 2023-08-04 NOTE — Progress Notes (Signed)
 Steve Nolan presents today for phlebotomy per MD orders. Phlebotomy procedure started at 0939 and ended at 094 511 grams removed via 16 gauge needle to left AC using phlebotomy kit. Pt declined to stay for post procedure observation period. Pt stated he has tolerated procedure multiple times prior without difficulty. Pt aware to call clinic with any questions or concerns. Pt verbalized understanding and had no further questions.  Patient tolerated procedure well. IV needle removed intact.

## 2023-08-04 NOTE — Progress Notes (Signed)
 Hematology and Oncology Follow Up Visit  Steve Nolan [id number] [birth year] 63 y.o. 08/04/2023   Principle Diagnosis:  Erythrocytosis -- JAK 2 (-)/ ASXL1 (+)     Current Therapy:        Phlebotomy to maintain Hct < 45%   Interim History:  Steve Nolan is here today for follow-up.    His only concern today is that he has felt a slight pain in his right groin for the past few weeks. 3/10 in nature. Does not recall it starting after heavy lifting or injury. This concerns him as his mother and sister both had Lymphoma of this area. No penile discharge, abdominal pain, new sexual partners.   He has lost some weight unintentionally over the past few months. He denies night sweats.   He has had no problems with bleeding.  He has had no change in bowel or bladder habits.    He has had no rashes.  He has had no cough or shortness of breath.  There is been no obvious change in bowel or bladder habits.  He is not sure when his last colonoscopy was.  He said it was a few years ago.  He has had no leg swelling.  His last iron studies that were done back in Dec showed a ferritin of 11 with an iron saturation of 18%.  Overall, his performance status is ECOG 1.   Wt Readings from Last 3 Encounters:  08/04/23 189 lb 1.3 oz (85.8 kg)  03/25/23 191 lb 3.2 oz (86.7 kg)  02/04/23 180 lb (81.6 kg)     Medications:  Allergies as of 08/04/2023       Reactions   Sulfa Antibiotics Rash   Sulfur Rash        Medication List        Accurate as of Aug 04, 2023  9:17 AM. If you have any questions, ask your nurse or doctor.          tadalafil  5 MG tablet Commonly known as: CIALIS  Take 1 tablet (5 mg total) by mouth daily.   VITAMIN B 12 PO Place 1 tablet under the tongue once a week.        Allergies:  Allergies  Allergen Reactions   Sulfa Antibiotics Rash   Sulfur Rash    Past Medical History, Surgical history, Social history, and Family History were reviewed and  updated.  Review of Systems: Review of Systems  Constitutional: Negative.   HENT: Negative.    Eyes: Negative.   Respiratory: Negative.    Cardiovascular: Negative.   Gastrointestinal: Negative.   Genitourinary: Negative.   Musculoskeletal: Negative.   Skin: Negative.   Neurological: Negative.   Endo/Heme/Allergies: Negative.   Psychiatric/Behavioral: Negative.       Physical Exam:  height is 5\' 9"  (1.753 m) and weight is 189 lb 1.3 oz (85.8 kg). His oral temperature is 97.9 F (36.6 C). His blood pressure is 128/77 and his pulse is 75. His respiration is 18 and oxygen saturation is 96%.   Wt Readings from Last 3 Encounters:  08/04/23 189 lb 1.3 oz (85.8 kg)  03/25/23 191 lb 3.2 oz (86.7 kg)  02/04/23 180 lb (81.6 kg)    Physical Exam Vitals reviewed.  HENT:     Head: Normocephalic and atraumatic.  Eyes:     Pupils: Pupils are equal, round, and reactive to light.  Cardiovascular:     Rate and Rhythm: Normal rate and regular rhythm.     Heart  sounds: Normal heart sounds.  Pulmonary:     Effort: Pulmonary effort is normal.     Breath sounds: Normal breath sounds.  Abdominal:     General: Bowel sounds are normal.     Palpations: Abdomen is soft.     Hernia: There is no hernia in the left inguinal area or right inguinal area.  Musculoskeletal:        General: No tenderness or deformity. Normal range of motion.     Cervical back: Normal range of motion.  Lymphadenopathy:     Head:     Right side of head: No submental, submandibular, tonsillar, preauricular, posterior auricular or occipital adenopathy.     Left side of head: No submental, submandibular, tonsillar, preauricular, posterior auricular or occipital adenopathy.     Cervical: No cervical adenopathy.     Right cervical: No superficial, deep or posterior cervical adenopathy.    Left cervical: No superficial, deep or posterior cervical adenopathy.     Upper Body:     Right upper body: No supraclavicular,  axillary, pectoral or epitrochlear adenopathy.     Left upper body: No supraclavicular, axillary, pectoral or epitrochlear adenopathy.     Lower Body: No right inguinal adenopathy. No left inguinal adenopathy.  Skin:    General: Skin is warm and dry.     Findings: No erythema or rash.  Neurological:     Mental Status: He is alert and oriented to person, place, and time.  Psychiatric:        Behavior: Behavior normal.        Thought Content: Thought content normal.        Judgment: Judgment normal.      Lab Results  Component Value Date   WBC 5.4 08/04/2023   HGB 14.2 08/04/2023   HCT 45.1 08/04/2023   MCV 79.4 (L) 08/04/2023   PLT 258 08/04/2023   Lab Results  Component Value Date   FERRITIN 11 (L) 03/20/2023   IRON 88 03/20/2023   TIBC 501 (H) 03/20/2023   UIBC 413 (H) 03/20/2023   IRONPCTSAT 18 03/20/2023   Lab Results  Component Value Date   RETICCTPCT 1.0 08/04/2023   RBC 5.59 08/04/2023   No results found for: "KPAFRELGTCHN", "LAMBDASER", "KAPLAMBRATIO" No results found for: "IGGSERUM", "IGA", "IGMSERUM" No results found for: "TOTALPROTELP", "ALBUMINELP", "A1GS", "A2GS", "BETS", "BETA2SER", "GAMS", "MSPIKE", "SPEI"   Chemistry      Component Value Date/Time   NA 138 07/08/2023 0829   K 3.8 07/08/2023 0829   CL 103 07/08/2023 0829   CO2 27 07/08/2023 0829   BUN 11 07/08/2023 0829   CREATININE 0.89 07/08/2023 0829      Component Value Date/Time   CALCIUM  9.2 07/08/2023 0829   ALKPHOS 68 07/08/2023 0829   AST 24 07/08/2023 0829   ALT 20 07/08/2023 0829   BILITOT 0.5 07/08/2023 0829     No diagnosis found.   Impression and Plan: Steve Nolan is a very pleasant 63 yo gentleman with intermittent mild erythrocytosis.  He is JAK2 negative.  We have not had to do a bone marrow biopsy on him.  I really do not think a bone marrow biopsy would really help us  out right now.    Phlebotomy today for HCT of 45.1% Discussed his concerns and family history. We have  elected to proceed with a CT C/AB/Pelvis with contrast to rule out lyphoma vs hernia vs other.   RTC 2 months MD, labs(CBC, CMP, iron, ferritin), Phlebotomy   Steve Nolan  Steve Manners, PA-C 5/12/20259:17 AM

## 2023-08-04 NOTE — Patient Instructions (Signed)

## 2023-08-13 ENCOUNTER — Encounter: Payer: Self-pay | Admitting: Hematology & Oncology

## 2023-08-15 ENCOUNTER — Ambulatory Visit (HOSPITAL_BASED_OUTPATIENT_CLINIC_OR_DEPARTMENT_OTHER)
Admission: RE | Admit: 2023-08-15 | Discharge: 2023-08-15 | Disposition: A | Discharge status: Home / Self Care | Source: Ambulatory Visit | Attending: Medical Oncology | Admitting: Medical Oncology

## 2023-08-15 DIAGNOSIS — N200 Calculus of kidney: Secondary | ICD-10-CM | POA: Insufficient documentation

## 2023-08-15 DIAGNOSIS — Z807 Family history of other malignant neoplasms of lymphoid, hematopoietic and related tissues: Secondary | ICD-10-CM | POA: Diagnosis present

## 2023-08-15 DIAGNOSIS — D7389 Other diseases of spleen: Secondary | ICD-10-CM | POA: Diagnosis not present

## 2023-08-15 DIAGNOSIS — K760 Fatty (change of) liver, not elsewhere classified: Secondary | ICD-10-CM | POA: Diagnosis not present

## 2023-08-15 DIAGNOSIS — R634 Abnormal weight loss: Secondary | ICD-10-CM | POA: Diagnosis present

## 2023-08-15 DIAGNOSIS — N400 Enlarged prostate without lower urinary tract symptoms: Secondary | ICD-10-CM | POA: Insufficient documentation

## 2023-08-15 DIAGNOSIS — R1031 Right lower quadrant pain: Secondary | ICD-10-CM | POA: Insufficient documentation

## 2023-08-15 MED ORDER — IOHEXOL 300 MG/ML  SOLN
100.0000 mL | Freq: Once | INTRAMUSCULAR | Status: AC | PRN
  Administered 2023-08-15: 100 mL via INTRAVENOUS

## 2023-08-19 ENCOUNTER — Ambulatory Visit: Payer: Self-pay | Admitting: Medical Oncology

## 2023-08-29 ENCOUNTER — Other Ambulatory Visit (INDEPENDENT_AMBULATORY_CARE_PROVIDER_SITE_OTHER)

## 2023-08-29 DIAGNOSIS — Z125 Encounter for screening for malignant neoplasm of prostate: Secondary | ICD-10-CM | POA: Diagnosis not present

## 2023-08-29 DIAGNOSIS — Z0000 Encounter for general adult medical examination without abnormal findings: Secondary | ICD-10-CM

## 2023-08-29 DIAGNOSIS — Z114 Encounter for screening for human immunodeficiency virus [HIV]: Secondary | ICD-10-CM

## 2023-08-29 LAB — LIPID PANEL
Cholesterol: 175 mg/dL (ref 0–200)
HDL: 44.9 mg/dL (ref >39.00)
LDL Cholesterol: 113 mg/dL — ABNORMAL HIGH (ref 0–99)
NonHDL: 130.49
Total CHOL/HDL Ratio: 4
Triglycerides: 85 mg/dL (ref 0.0–149.0)
VLDL: 17 mg/dL (ref 0.0–40.0)

## 2023-08-29 LAB — COMPREHENSIVE METABOLIC PANEL WITH GFR
ALT: 18 U/L (ref 0–53)
AST: 20 U/L (ref 0–37)
Albumin: 4.3 g/dL (ref 3.5–5.2)
Alkaline Phosphatase: 67 U/L (ref 39–117)
BUN: 13 mg/dL (ref 6–23)
CO2: 30 meq/L (ref 19–32)
Calcium: 9.1 mg/dL (ref 8.4–10.5)
Chloride: 103 meq/L (ref 96–112)
Creatinine, Ser: 0.93 mg/dL (ref 0.40–1.50)
GFR: 86.98 mL/min (ref >60.00)
Glucose, Bld: 90 mg/dL (ref 70–99)
Potassium: 4.5 meq/L (ref 3.5–5.1)
Sodium: 138 meq/L (ref 135–145)
Total Bilirubin: 0.6 mg/dL (ref 0.2–1.2)
Total Protein: 6.7 g/dL (ref 6.0–8.3)

## 2023-08-29 LAB — CBC
HCT: 40.8 % (ref 39.0–52.0)
Hemoglobin: 13.2 g/dL (ref 13.0–17.0)
MCHC: 32.4 g/dL (ref 30.0–36.0)
MCV: 76.5 fl — ABNORMAL LOW (ref 78.0–100.0)
Platelets: 241 10*3/uL (ref 150.0–400.0)
RBC: 5.33 Mil/uL (ref 4.22–5.81)
RDW: 14.9 % (ref 11.5–15.5)
WBC: 5.4 10*3/uL (ref 4.0–10.5)

## 2023-08-30 LAB — HIV ANTIBODY (ROUTINE TESTING W REFLEX): HIV 1&2 Ab, 4th Generation: NONREACTIVE

## 2023-09-01 ENCOUNTER — Ambulatory Visit: Payer: Self-pay | Admitting: Family Medicine

## 2023-09-01 LAB — PSA: PSA: 0.84 ng/mL (ref 0.10–4.00)

## 2023-09-10 ENCOUNTER — Ambulatory Visit (INDEPENDENT_AMBULATORY_CARE_PROVIDER_SITE_OTHER): Admitting: *Deleted

## 2023-09-10 VITALS — BP 123/70 | HR 69 | Temp 97.8°F | Resp 16 | Ht 69.0 in | Wt 196.2 lb

## 2023-09-10 DIAGNOSIS — Z0000 Encounter for general adult medical examination without abnormal findings: Secondary | ICD-10-CM | POA: Diagnosis not present

## 2023-09-10 NOTE — Patient Instructions (Addendum)
 Steve Nolan , Thank you for taking time out of your busy schedule to complete your Annual Wellness Visit with me. I enjoyed our conversation and look forward to speaking with you again next year. I, as well as your care team,  appreciate your ongoing commitment to your health goals. Please review the following plan we discussed and let me know if I can assist you in the future. Your Game plan/ To Do List   Follow up Visits: Next Medicare AWV with our clinical staff: 09/10/24 8:20 telephone visit.    Next Office Visit with your provider: 09/15/23 8am  Clinician Recommendations:  Aim for 30 minutes of exercise or brisk walking, 6-8 glasses of water, and 5 servings of fruits and vegetables each day.       This is a list of the screening recommended for you and due dates:  Health Maintenance  Topic Date Due   Medicare Annual Wellness Visit  Never done   COVID-19 Vaccine (4 - 2024-25 season) 09/09/2024*   Flu Shot  10/24/2023   DTaP/Tdap/Td vaccine (2 - Td or Tdap) 01/23/2027   Colon Cancer Screening  08/30/2028   Hepatitis C Screening  Completed   HIV Screening  Completed   Zoster (Shingles) Vaccine  Completed   HPV Vaccine  Aged Out   Meningitis B Vaccine  Aged Out  *Topic was postponed. The date shown is not the original due date.    Advanced directives: (Copy Requested) Please bring a copy of your health care power of attorney and living will to the office to be added to your chart at your convenience. You can mail to [facility] [address] [place], [place] [address] or email to [email or web address] Advance Care Planning is important because it:  [x]  Makes sure you receive the medical care that is consistent with your values, goals, and preferences  [x]  It provides guidance to your family and loved ones and reduces their decisional burden about whether or not they are making the right decisions based on your wishes.  Follow the link provided in your after  visit summary or read over the paperwork we have mailed to you to help you started getting your Advance Directives in place. If you need assistance in completing these, please reach out to us  so that we can help you!  See attachments for Preventive Care and Fall Prevention Tips.

## 2023-09-10 NOTE — Progress Notes (Signed)
 Subjective:   Steve Nolan is a 63 y.o. who presents for a Medicare Wellness preventive visit.  As a reminder, Annual Wellness Visits don't include a physical exam, and some assessments may be limited, especially if this visit is performed virtually. We may recommend an in-person follow-up visit with your provider if needed.  Visit Complete: In person  Persons Participating in Visit: Patient.  AWV Questionnaire: Yes: Patient Medicare AWV questionnaire was completed by the patient on 09/03/23; I have confirmed that all information answered by patient is correct and no changes since this date.  Cardiac Risk Factors include: advanced age (>34men, >16 women);male gender   Subjective:   Steve Nolan is a 63 y.o. who presents for a Medicare Wellness preventive visit.  As a reminder, Annual Wellness Visits don't include a physical exam, and some assessments may be limited, especially if this visit is performed virtually. We may recommend an in-person follow-up visit with your provider if needed.  Visit Complete: In person  Persons Participating in Visit: Patient.  AWV Questionnaire: Yes: Patient Medicare AWV questionnaire was completed by the patient on 09/03/23; I have confirmed that all information answered by patient is correct and no changes since this date.  Cardiac Risk Factors include: advanced age (>83men, >67 women);male gender     Objective:    Today's Vitals   09/10/23 0814  BP: 123/70  Pulse: 69  Resp: 16  Temp: 97.8 F (36.6 C)  TempSrc: Oral  SpO2: 98%  Weight: 196 lb 3.2 oz (89 kg)  Height: 5' 9 (1.753 m)   Body mass index is 28.97 kg/m.     09/10/2023    8:31 AM 08/04/2023    9:02 AM 02/04/2023    8:48 AM 01/22/2023    7:34 PM 01/21/2023   11:58 PM 12/03/2022    1:48 PM 08/06/2022    8:42 AM  Advanced Directives  Does Patient Have a Medical Advance Directive? Yes No No No No No Yes  Type of Advance Directive Living will      Healthcare Power  of [place];Living will  Does patient want to make changes to medical advance directive? No - Patient declined        Copy of Healthcare Power of Attorney in Chart?       No - copy requested  Would patient like information on creating a medical advance directive?  No - Patient declined No - Patient declined    No - Patient declined    Current Medications (verified) Outpatient Encounter Medications as of 09/10/2023  Medication Sig   Cyanocobalamin (VITAMIN B 12 PO) Place 1 tablet under the tongue once a week.    tadalafil  (CIALIS ) 5 MG tablet Take 1 tablet (5 mg total) by mouth daily.   No facility-administered encounter medications on file as of 09/10/2023.    Allergies (verified) Sulfa antibiotics and Sulfur   History: Past Medical History:  Diagnosis Date   A-fib (HCC)    20 years ago,went to cardiologist no treatment needed and no follow-up - dx. torn muscle.   Cataract    bilateral, monitoring   Chronic low back pain 11/11/2016   Kidney stones    Skin cancer    Past Surgical History:  Procedure Laterality Date   COLONOSCOPY     around 2011. Dr Arlyn Bergeron. [facility]. Arnetta Lank he was due in 2021.    ESOPHAGOGASTRODUODENOSCOPY     Around 2011   EXTRACORPOREAL SHOCK WAVE LITHOTRIPSY Left 01/09/2017  Procedure: LEFT EXTRACORPOREAL SHOCK WAVE LITHOTRIPSY (ESWL);  Surgeon: Marco Severs, MD;  Location: [facility];  Service: Urology;  Laterality: Left;   FUNCTIONAL ENDOSCOPIC SINUS SURGERY Bilateral 1998   deviated septum   left shoulder surgery Left    arthoscopic   Family History  Problem Relation Age of Onset   Hypertension Mother    Diabetes Mother    Non-Hodgkin's lymphoma Mother    Cancer Mother        non hodkins ymphoma   Heart disease Father    Skin cancer Father    Lung cancer Sister    Non-Hodgkin's lymphoma Sister    Cancer Sister        non hodgkins lymphoma and lung cancer   Alcohol abuse Maternal Grandmother    Stroke Maternal Grandmother     Hypertension Maternal Grandmother    Drug abuse Maternal Grandmother    Cancer Maternal Grandmother        breast cancer   Diabetes Maternal Grandfather    Alcohol abuse Maternal Grandfather    Heart disease Maternal Grandfather    Hypertension Maternal Grandfather    Cancer Maternal Grandfather        lung cancer   Alcohol abuse Paternal Grandfather    Heart disease Paternal Grandfather    Colon cancer Neg Hx    Esophageal cancer Neg Hx    Prostate cancer Neg Hx    Social History   Socioeconomic History   Marital status: Divorced    Spouse name: Not on file   Number of children: Not on file   Years of education: Not on file   Highest education level: Master's degree (e.g., MA, MS, MEng, MEd, MSW, MBA)  Occupational History   Not on file  Tobacco Use   Smoking status: Never   Smokeless tobacco: Never  Vaping Use   Vaping status: Never Used  Substance and Sexual Activity   Alcohol use: Not Currently    Comment: ocassionally    Drug use: No   Sexual activity: Yes  Other Topics Concern   Not on file  Social History Narrative   Not on file   Social Drivers of Health   Financial Resource Strain: Low Risk  (09/10/2023)   Overall Financial Resource Strain (CARDIA)    Difficulty of Paying Living Expenses: Not hard at all  Food Insecurity: No Food Insecurity (09/10/2023)   Hunger Vital Sign    Worried About Running Out of Food in the Last Year: Never true    Ran Out of Food in the Last Year: Never true  Transportation Needs: No Transportation Needs (09/10/2023)   PRAPARE - Administrator, Civil Service (Medical): No    Lack of Transportation (Non-Medical): No  Physical Activity: Sufficiently Active (09/10/2023)   Exercise Vital Sign    Days of Exercise per Week: 3 days    Minutes of Exercise per Session: 60 min  Stress: No Stress Concern Present (09/10/2023)   [employer] of Occupational Health - Occupational Stress Questionnaire    Feeling of Stress:  Not at all  Social Connections: Socially Isolated (09/10/2023)   Social Connection and Isolation Panel    Frequency of Communication with Friends and Family: Twice a week    Frequency of Social Gatherings with Friends and Family: Twice a week    Attends Religious Services: Never    Database administrator or Organizations: No    Attends Banker Meetings: Never    Marital Status:  Divorced    Tobacco Counseling Counseling given: Not Answered    Clinical Intake:  Pre-visit preparation completed: Yes  Pain : No/denies pain    BMI - recorded: 28.97 Nutritional Status: BMI 25 -29 Overweight Nutritional Risks: None Diabetes: No  No results found for: HGBA1C   How often do you need to have someone help you when you read instructions, pamphlets, or other written materials from your doctor or pharmacy?: 1 - Never What is the last grade level you completed in school?: Master's degree  Interpreter Needed?: No  Information entered by :: Susa Engman, CMA   Activities of Daily Living     09/03/2023    9:29 AM  In your present state of health, do you have any difficulty performing the following activities:  Hearing? 0  Vision? 0  Difficulty concentrating or making decisions? 0  Walking or climbing stairs? 0  Dressing or bathing? 0  Doing errands, shopping? 0  Preparing Food and eating ? N  Using the Toilet? N  In the past six months, have you accidently leaked urine? N  Do you have problems with loss of bowel control? N  Managing your Medications? N  Managing your Finances? N  Housekeeping or managing your Housekeeping? N    Patient Care Team: Jobe Mulder, DO as PCP - General (Family Medicine) Nathaniel Bald, M.D., PA  I have updated your Care Teams any recent Medical Services you may have received from other providers in the past year.     Assessment:   This is a routine wellness examination for Steve Nolan.  Hearing/Vision  screen Hearing Screening - Comments:: Denies hearing difficulties.  Vision Screening - Comments:: Wears RX glasses -- up to date with routine eye exams with dr Danley Dusky    Goals Addressed   None    Depression Screen     09/10/2023    8:29 AM 09/02/2022    8:36 AM 08/29/2021    7:48 AM 10/30/2020    2:10 PM 08/28/2020    8:46 AM 12/24/2019   12:39 PM 01/28/2017    9:43 AM  PHQ 2/9 Scores  PHQ - 2 Score 0 0 0 0 0 0 0  PHQ- 9 Score  0 0        Fall Risk     09/03/2023    9:29 AM 09/02/2022    8:36 AM 08/29/2021    7:48 AM 08/28/2020    8:45 AM 01/28/2017    9:43 AM  Fall Risk   Falls in the past year? 0 0 0 0 No   Number falls in past yr: 0 0 0 0   Injury with Fall? 0 0 0 0   Risk for fall due to : No Fall Risks No Fall Risks No Fall Risks No Fall Risks   Follow up  Falls evaluation completed Falls evaluation completed  Falls evaluation completed       Data saved with a previous flowsheet row definition    MEDICARE RISK AT HOME:  Medicare Risk at Home Any stairs in or around the home?: (Patient-Rptd) No Home free of loose throw rugs in walkways, pet beds, electrical cords, etc?: (Patient-Rptd) Yes Adequate lighting in your home to reduce risk of falls?: (Patient-Rptd) Yes Life alert?: (Patient-Rptd) No Use of a cane, walker or w/c?: (Patient-Rptd) No Grab bars in the bathroom?: (Patient-Rptd) No Shower chair or bench in shower?: (Patient-Rptd) No Elevated toilet seat or a handicapped toilet?: (Patient-Rptd) No  TIMED UP AND  GO:  Was the test performed?  Yes  Length of time to ambulate 10 feet: 5 sec Gait steady and fast without use of assistive device  Cognitive Function: 6CIT completed        09/10/2023    8:29 AM  6CIT Screen  What Year? 0 points  What month? 0 points  What time? 0 points  Count back from 20 0 points  Months in reverse 0 points  Repeat phrase 0 points  Total Score 0 points    Immunizations Immunization History  Administered Date(s) Administered    Hepatitis B, ADULT 01/28/2017, 02/27/2017, 08/08/2017   Influenza,inj,Quad PF,6+ Mos 02/20/2017, 12/24/2019   Moderna Sars-Covid-2 Vaccination 05/25/2019, 06/22/2019, 01/24/2020   Tdap 01/22/2017   Zoster Recombinant(Shingrix) 08/25/2019, 12/01/2019    Screening Tests Health Maintenance  Topic Date Due   Medicare Annual Wellness (AWV)  Never done   COVID-19 Vaccine (4 - 2024-25 season) 09/09/2024 (Originally 11/24/2022)   INFLUENZA VACCINE  10/24/2023   DTaP/Tdap/Td (2 - Td or Tdap) 01/23/2027   Colonoscopy  08/30/2028   Hepatitis C Screening  Completed   HIV Screening  Completed   Zoster Vaccines- Shingrix  Completed   HPV VACCINES  Aged Out   Meningococcal B Vaccine  Aged Out    Health Maintenance  Health Maintenance Due  Topic Date Due   Medicare Annual Wellness (AWV)  Never done   Health Maintenance Items Addressed: All health maintenance up to date.  Additional Screening:  Vision Screening: Recommended annual ophthalmology exams for early detection of glaucoma and other disorders of the eye. Would you like a referral to an eye doctor? No    Dental Screening: Recommended annual dental exams for proper oral hygiene  Community Resource Referral / Chronic Care Management: CRR required this visit?  No   CCM required this visit?  No   Plan:    I have personally reviewed and noted the following in the patient's chart:   Medical and social history Use of alcohol, tobacco or illicit drugs  Current medications and supplements including opioid prescriptions. Patient is not currently taking opioid prescriptions. Functional ability and status Nutritional status Physical activity Advanced directives List of other physicians Hospitalizations, surgeries, and ER visits in previous 12 months Vitals Screenings to include cognitive, depression, and falls Referrals and appointments  In addition, I have reviewed and discussed with patient certain preventive protocols,  quality metrics, and best practice recommendations. A written personalized care plan for preventive services as well as general preventive health recommendations were provided to patient.   Susa Engman, CMA   09/10/2023   After Visit Summary: (In Person-Printed) AVS printed and given to the patient  Notes: Nothing significant to report at this time.

## 2023-09-15 ENCOUNTER — Ambulatory Visit (INDEPENDENT_AMBULATORY_CARE_PROVIDER_SITE_OTHER): Admitting: Family Medicine

## 2023-09-15 ENCOUNTER — Encounter: Payer: Self-pay | Admitting: Family Medicine

## 2023-09-15 VITALS — BP 122/74 | HR 73 | Temp 98.0°F | Resp 16 | Ht 69.0 in | Wt 190.4 lb

## 2023-09-15 DIAGNOSIS — Z0000 Encounter for general adult medical examination without abnormal findings: Secondary | ICD-10-CM

## 2023-09-15 MED ORDER — TADALAFIL 5 MG PO TABS: 5.0000 mg | ORAL_TABLET | Freq: Every day | ORAL | 3 refills | Status: AC

## 2023-09-15 NOTE — Progress Notes (Signed)
 Chief Complaint  Patient presents with   Annual Exam    CPE    Well Male Steve Nolan is here for a complete physical.   His last physical was >1 year ago.  Current diet: in general, a healthy diet.  Current exercise: stretching, strength training Weight trend: stable Fatigue out of ordinary? No. Seat belt? Yes.   Advanced directive? Yes  Health maintenance Shingrix- Yes Colonoscopy- Yes Tetanus- Yes HIV- Yes Hep C- Yes   Past Medical History:  Diagnosis Date   A-fib (HCC)    20 years ago,went to cardiologist no treatment needed and no follow-up - dx. torn muscle.   Cataract    bilateral, monitoring   Chronic low back pain 11/11/2016   Kidney stones    Skin cancer       Past Surgical History:  Procedure Laterality Date   COLONOSCOPY     around 2011. Dr Corinthia Clapper. [facility]. [place] he was due in 2021.    ESOPHAGOGASTRODUODENOSCOPY     Around 2011   EXTRACORPOREAL SHOCK WAVE LITHOTRIPSY Left 01/09/2017   Procedure: LEFT EXTRACORPOREAL SHOCK WAVE LITHOTRIPSY (ESWL);  Surgeon: Sherrilee Belvie CROME, MD;  Location: [facility];  Service: Urology;  Laterality: Left;   FUNCTIONAL ENDOSCOPIC SINUS SURGERY Bilateral 1998   deviated septum   left shoulder surgery Left    arthoscopic    Medications  Current Outpatient Medications on File Prior to Visit  Medication Sig Dispense Refill   Cyanocobalamin (VITAMIN B 12 PO) Place 1 tablet under the tongue once a week.      tadalafil  (CIALIS ) 5 MG tablet Take 1 tablet (5 mg total) by mouth daily. 90 tablet 3    Allergies Allergies  Allergen Reactions   Sulfa Antibiotics Rash   Sulfur Rash    Family History Family History  Problem Relation Age of Onset   Hypertension Mother    Diabetes Mother    Non-Hodgkin's lymphoma Mother    Cancer Mother        non hodkins ymphoma   Heart disease Father    Skin cancer Father    Lung cancer Sister    Non-Hodgkin's lymphoma Sister    Cancer Sister        non hodgkins  lymphoma and lung cancer   Alcohol abuse Maternal Grandmother    Stroke Maternal Grandmother    Hypertension Maternal Grandmother    Drug abuse Maternal Grandmother    Cancer Maternal Grandmother        breast cancer   Diabetes Maternal Grandfather    Alcohol abuse Maternal Grandfather    Heart disease Maternal Grandfather    Hypertension Maternal Grandfather    Cancer Maternal Grandfather        lung cancer   Alcohol abuse Paternal Grandfather    Heart disease Paternal Grandfather    Colon cancer Neg Hx    Esophageal cancer Neg Hx    Prostate cancer Neg Hx     Review of Systems: Constitutional:  no fevers Eye:  no recent significant change in vision Ear/Nose/Mouth/Throat:  Ears:  no hearing loss Nose/Mouth/Throat:  no complaints of nasal congestion, no sore throat Cardiovascular:  no chest pain Respiratory:  no shortness of breath Gastrointestinal:  no change in bowel habits GU:  Male: negative for dysuria, frequency Musculoskeletal/Extremities:  no new joint pain Integumentary (Skin/Breast):  no abnormal skin lesions reported Neurologic:  no headaches Endocrine: No unexpected weight changes Hematologic/Lymphatic:  no abnormal bleeding  Exam BP 122/74 (BP  Location: Left Arm, Patient Position: Sitting)   Pulse 73   Temp 98 F (36.7 C) (Oral)   Resp 16   Ht 5' 9 (1.753 m)   Wt 190 lb 6.4 oz (86.4 kg)   SpO2 96%   BMI 28.12 kg/m  General:  well developed, well nourished, in no apparent distress Skin:  no significant moles, warts, or growths Head:  no masses, lesions, or tenderness Eyes:  pupils equal and round, sclera anicteric without injection Ears:  canals without lesions, TMs shiny without retraction, no obvious effusion, no erythema Nose:  nares patent, mucosa normal Throat/Pharynx:  lips and gingiva without lesion; tongue and uvula midline; non-inflamed pharynx; no exudates or postnasal drainage Neck: neck supple without adenopathy, thyromegaly, or  masses Cardiac: RRR, no bruits, no LE edema Lungs:  clear to auscultation, breath sounds equal bilaterally, no respiratory distress Abdomen: BS+, soft, non-tender, non-distended, no masses or organomegaly noted Rectal: Deferred Musculoskeletal:  symmetrical muscle groups noted without atrophy or deformity Neuro:  gait normal; deep tendon reflexes normal and symmetric Psych: well oriented with normal range of affect and appropriate judgment/insight  Assessment and Plan  Well adult exam   Well 63 y.o. male. Counseled on diet and exercise. Advanced directive form requested today.  He had labs done last week.  Immunizations, labs, and further orders as above. Follow up in 1 yr. The patient voiced understanding and agreement to the plan.  Mabel [place], DO 09/15/23 8:10 AM

## 2023-09-15 NOTE — Patient Instructions (Addendum)
 Keep the diet clean and stay active.  Please get me a copy of your advanced directive form at your convenience.   Foods that may reduce pain: 1) Ginger 2) Blueberries 3) Salmon 4) Pumpkin seeds 5) Dark chocolate 6) Turmeric 7) Tart cherries 8) Virgin olive oil 9) Chili peppers 10) Mint 11) Krill oil  Let us  know if you need anything.

## 2023-10-06 ENCOUNTER — Inpatient Hospital Stay

## 2023-10-06 ENCOUNTER — Inpatient Hospital Stay (HOSPITAL_BASED_OUTPATIENT_CLINIC_OR_DEPARTMENT_OTHER): Admitting: Medical Oncology

## 2023-10-06 ENCOUNTER — Inpatient Hospital Stay: Attending: Hematology & Oncology

## 2023-10-06 ENCOUNTER — Encounter: Payer: Self-pay | Admitting: Medical Oncology

## 2023-10-06 DIAGNOSIS — D751 Secondary polycythemia: Secondary | ICD-10-CM | POA: Diagnosis present

## 2023-10-06 DIAGNOSIS — N400 Enlarged prostate without lower urinary tract symptoms: Secondary | ICD-10-CM | POA: Insufficient documentation

## 2023-10-06 LAB — CBC WITH DIFFERENTIAL (CANCER CENTER ONLY)
Abs Immature Granulocytes: 0.01 K/uL (ref 0.00–0.07)
Basophils Absolute: 0 K/uL (ref 0.0–0.1)
Basophils Relative: 1 %
Eosinophils Absolute: 0.2 K/uL (ref 0.0–0.5)
Eosinophils Relative: 3 %
HCT: 42.6 % (ref 39.0–52.0)
Hemoglobin: 13 g/dL (ref 13.0–17.0)
Immature Granulocytes: 0 %
Lymphocytes Relative: 38 %
Lymphs Abs: 2.1 K/uL (ref 0.7–4.0)
MCH: 23.8 pg — ABNORMAL LOW (ref 26.0–34.0)
MCHC: 30.5 g/dL (ref 30.0–36.0)
MCV: 78 fL — ABNORMAL LOW (ref 80.0–100.0)
Monocytes Absolute: 0.5 K/uL (ref 0.1–1.0)
Monocytes Relative: 9 %
Neutro Abs: 2.6 K/uL (ref 1.7–7.7)
Neutrophils Relative %: 49 %
Platelet Count: 249 K/uL (ref 150–400)
RBC: 5.46 MIL/uL (ref 4.22–5.81)
RDW: 14.5 % (ref 11.5–15.5)
WBC Count: 5.4 K/uL (ref 4.0–10.5)
nRBC: 0 % (ref 0.0–0.2)

## 2023-10-06 LAB — CMP (CANCER CENTER ONLY)
ALT: 20 U/L (ref 0–44)
AST: 25 U/L (ref 15–41)
Albumin: 4.3 g/dL (ref 3.5–5.0)
Alkaline Phosphatase: 65 U/L (ref 38–126)
Anion gap: 6 (ref 5–15)
BUN: 12 mg/dL (ref 8–23)
CO2: 29 mmol/L (ref 22–32)
Calcium: 9.2 mg/dL (ref 8.9–10.3)
Chloride: 104 mmol/L (ref 98–111)
Creatinine: 0.94 mg/dL (ref 0.61–1.24)
GFR, Estimated: >60 mL/min (ref >60)
Glucose, Bld: 74 mg/dL (ref 70–99)
Potassium: 4.2 mmol/L (ref 3.5–5.1)
Sodium: 139 mmol/L (ref 135–145)
Total Bilirubin: 0.4 mg/dL (ref 0.0–1.2)
Total Protein: 6.8 g/dL (ref 6.5–8.1)

## 2023-10-06 LAB — FERRITIN: Ferritin: 15 ng/mL — ABNORMAL LOW (ref 24–336)

## 2023-10-06 LAB — IRON AND IRON BINDING CAPACITY (CC-WL,HP ONLY)
Iron: 36 ug/dL — ABNORMAL LOW (ref 45–182)
Saturation Ratios: 7 % — ABNORMAL LOW (ref 17.9–39.5)
TIBC: 494 ug/dL — ABNORMAL HIGH (ref 250–450)
UIBC: 458 ug/dL

## 2023-10-06 LAB — RETICULOCYTES
Immature Retic Fract: 13.8 % (ref 2.3–15.9)
RBC.: 5.48 MIL/uL (ref 4.22–5.81)
Retic Count, Absolute: 47.7 K/uL (ref 19.0–186.0)
Retic Ct Pct: 0.9 % (ref 0.4–3.1)

## 2023-10-06 NOTE — Progress Notes (Addendum)
 Hematology and Oncology Follow Up Visit  Steve Nolan [id number] 1960/09/30 63 y.o. 10/06/2023   Principle Diagnosis:  Erythrocytosis -- JAK 2 (-)/ ASXL1 (+)     Current Therapy:        Phlebotomy to maintain Hct < 45%   Interim History:  Steve Nolan is here today for follow-up.    Today he states that he has been good. The pain in his groin that he mentioned at his last visit has resolved. He did have a CT of this area which did not show any acute reasons for concern but did show a benign appearing splenic lesion, hepatic steatosis, non-obstructive kidney stones and some prostate enlargement. He is working with Urology regarding the prostate enlargement and is planning an upcoming steam procedure to help once he gets back from his upcoming [place] cruise.   He has lost some weight unintentionally over the past few months. He denies night sweats.   He has had no problems with bleeding.  He has had no change in bowel or bladder habits.    He has had no rashes.  He has had no cough or shortness of breath.  There is been no obvious change in bowel or bladder habits.  He is not sure when his last colonoscopy was.  He said it was a few years ago.  He has had no leg swelling.  His last iron studies that were done back in May showed a ferritin of 13 with an iron saturation of 15%.  Overall, his performance status is ECOG 1.   Wt Readings from Last 3 Encounters:  10/06/23 191 lb 12.8 oz (87 kg)  09/15/23 190 lb 6.4 oz (86.4 kg)  09/10/23 196 lb 3.2 oz (89 kg)     Medications:  Allergies as of 10/06/2023       Reactions   Sulfa Antibiotics Rash   Sulfur Rash        Medication List        Accurate as of October 06, 2023 10:11 AM. If you have any questions, ask your nurse or doctor.          tadalafil  5 MG tablet Commonly known as: CIALIS  Take 1 tablet (5 mg total) by mouth daily.   VITAMIN B 12 PO Place 1 tablet under the tongue once a week.         Allergies:  Allergies  Allergen Reactions   Sulfa Antibiotics Rash   Sulfur Rash    Past Medical History, Surgical history, Social history, and Family History were reviewed and updated.  Review of Systems: Review of Systems  Constitutional: Negative.   HENT: Negative.    Eyes: Negative.   Respiratory: Negative.    Cardiovascular: Negative.   Gastrointestinal: Negative.   Genitourinary: Negative.   Musculoskeletal: Negative.   Skin: Negative.   Neurological: Negative.   Endo/Heme/Allergies: Negative.   Psychiatric/Behavioral: Negative.       Physical Exam:  height is 5' 9 (1.753 m) and weight is 191 lb 12.8 oz (87 kg). His oral temperature is 97.8 F (36.6 C). His blood pressure is 121/75 and his pulse is 67. His respiration is 19 and oxygen saturation is 99%.   Wt Readings from Last 3 Encounters:  10/06/23 191 lb 12.8 oz (87 kg)  09/15/23 190 lb 6.4 oz (86.4 kg)  09/10/23 196 lb 3.2 oz (89 kg)    Physical Exam Vitals reviewed.  HENT:     Head: Normocephalic and atraumatic.  Eyes:  Pupils: Pupils are equal, round, and reactive to light.  Cardiovascular:     Rate and Rhythm: Normal rate and regular rhythm.     Heart sounds: Normal heart sounds.  Pulmonary:     Effort: Pulmonary effort is normal.     Breath sounds: Normal breath sounds.  Abdominal:     General: Bowel sounds are normal.     Palpations: Abdomen is soft.     Hernia: There is no hernia in the left inguinal area or right inguinal area.  Musculoskeletal:        General: No tenderness or deformity. Normal range of motion.     Cervical back: Normal range of motion.  Lymphadenopathy:     Head:     Right side of head: No submental, submandibular, tonsillar, preauricular, posterior auricular or occipital adenopathy.     Left side of head: No submental, submandibular, tonsillar, preauricular, posterior auricular or occipital adenopathy.     Cervical: No cervical adenopathy.     Right cervical:  No superficial, deep or posterior cervical adenopathy.    Left cervical: No superficial, deep or posterior cervical adenopathy.     Upper Body:     Right upper body: No supraclavicular, axillary, pectoral or epitrochlear adenopathy.     Left upper body: No supraclavicular, axillary, pectoral or epitrochlear adenopathy.     Lower Body: No right inguinal adenopathy. No left inguinal adenopathy.  Skin:    General: Skin is warm and dry.     Findings: No erythema or rash.  Neurological:     Mental Status: He is alert and oriented to person, place, and time.  Psychiatric:        Behavior: Behavior normal.        Thought Content: Thought content normal.        Judgment: Judgment normal.      Lab Results  Component Value Date   WBC 5.4 10/06/2023   HGB 13.0 10/06/2023   HCT 42.6 10/06/2023   MCV 78.0 (L) 10/06/2023   PLT 249 10/06/2023   Lab Results  Component Value Date   FERRITIN 13 (L) 08/04/2023   IRON 74 08/04/2023   TIBC 490 (H) 08/04/2023   UIBC 416 (H) 08/04/2023   IRONPCTSAT 15 (L) 08/04/2023   Lab Results  Component Value Date   RETICCTPCT 0.9 10/06/2023   RBC 5.46 10/06/2023   RBC 5.48 10/06/2023   No results found for: KPAFRELGTCHN, LAMBDASER, KAPLAMBRATIO No results found for: IGGSERUM, IGA, IGMSERUM No results found for: STEPHANY CARLOTA BENSON MARKEL EARLA JOANNIE DOC VICK, SPEI   Chemistry      Component Value Date/Time   NA 139 10/06/2023 0909   K 4.2 10/06/2023 0909   CL 104 10/06/2023 0909   CO2 29 10/06/2023 0909   BUN 12 10/06/2023 0909   CREATININE 0.94 10/06/2023 0909      Component Value Date/Time   CALCIUM  9.2 10/06/2023 0909   ALKPHOS 65 10/06/2023 0909   AST 25 10/06/2023 0909   ALT 20 10/06/2023 0909   BILITOT 0.4 10/06/2023 9090     Encounter Diagnoses  Name Primary?   Hereditary hemochromatosis (HCC) Yes   Erythrocytosis    Impression and Plan: Steve Nolan is a very pleasant 63 yo  gentleman with intermittent mild erythrocytosis.  He is JAK2 negative.  We have not had to do a bone marrow biopsy on him.  I really do not think a bone marrow biopsy would really help us  out right now.  Continue working with PCP/Urology for his CT findings of enlarged prostate/hepatic changes No Phlebotomy today.   RTC 2 months MD, labs(CBC, CMP, iron, ferritin), Phlebotomy   Lauraine CHRISTELLA Dais, PA-C 7/14/202510:11 AM

## 2023-12-08 ENCOUNTER — Inpatient Hospital Stay: Attending: Hematology & Oncology

## 2023-12-08 ENCOUNTER — Other Ambulatory Visit: Payer: Self-pay

## 2023-12-08 ENCOUNTER — Inpatient Hospital Stay (HOSPITAL_BASED_OUTPATIENT_CLINIC_OR_DEPARTMENT_OTHER): Admitting: Hematology & Oncology

## 2023-12-08 ENCOUNTER — Inpatient Hospital Stay

## 2023-12-08 VITALS — BP 129/64 | HR 62 | Temp 97.9°F | Resp 18 | Ht 69.0 in | Wt 190.0 lb

## 2023-12-08 DIAGNOSIS — G8929 Other chronic pain: Secondary | ICD-10-CM | POA: Diagnosis not present

## 2023-12-08 DIAGNOSIS — R749 Abnormal serum enzyme level, unspecified: Secondary | ICD-10-CM

## 2023-12-08 DIAGNOSIS — D751 Secondary polycythemia: Secondary | ICD-10-CM | POA: Diagnosis present

## 2023-12-08 DIAGNOSIS — M545 Low back pain, unspecified: Secondary | ICD-10-CM

## 2023-12-08 LAB — CBC WITH DIFFERENTIAL (CANCER CENTER ONLY)
Abs Immature Granulocytes: 0.01 K/uL (ref 0.00–0.07)
Basophils Absolute: 0 K/uL (ref 0.0–0.1)
Basophils Relative: 0 %
Eosinophils Absolute: 0.1 K/uL (ref 0.0–0.5)
Eosinophils Relative: 3 %
HCT: 45.2 % (ref 39.0–52.0)
Hemoglobin: 14.3 g/dL (ref 13.0–17.0)
Immature Granulocytes: 0 %
Lymphocytes Relative: 38 %
Lymphs Abs: 1.9 K/uL (ref 0.7–4.0)
MCH: 24.7 pg — ABNORMAL LOW (ref 26.0–34.0)
MCHC: 31.6 g/dL (ref 30.0–36.0)
MCV: 77.9 fL — ABNORMAL LOW (ref 80.0–100.0)
Monocytes Absolute: 0.6 K/uL (ref 0.1–1.0)
Monocytes Relative: 12 %
Neutro Abs: 2.4 K/uL (ref 1.7–7.7)
Neutrophils Relative %: 47 %
Platelet Count: 244 K/uL (ref 150–400)
RBC: 5.8 MIL/uL (ref 4.22–5.81)
RDW: 17.1 % — ABNORMAL HIGH (ref 11.5–15.5)
WBC Count: 5.1 K/uL (ref 4.0–10.5)
nRBC: 0 % (ref 0.0–0.2)

## 2023-12-08 LAB — CMP (CANCER CENTER ONLY)
ALT: 33 U/L (ref 0–44)
AST: 36 U/L (ref 15–41)
Albumin: 4.4 g/dL (ref 3.5–5.0)
Alkaline Phosphatase: 78 U/L (ref 38–126)
Anion gap: 10 (ref 5–15)
BUN: 11 mg/dL (ref 8–23)
CO2: 25 mmol/L (ref 22–32)
Calcium: 9 mg/dL (ref 8.9–10.3)
Chloride: 104 mmol/L (ref 98–111)
Creatinine: 0.93 mg/dL (ref 0.61–1.24)
GFR, Estimated: >60 mL/min (ref >60)
Glucose, Bld: 98 mg/dL (ref 70–99)
Potassium: 4 mmol/L (ref 3.5–5.1)
Sodium: 139 mmol/L (ref 135–145)
Total Bilirubin: 0.3 mg/dL (ref 0.0–1.2)
Total Protein: 7.1 g/dL (ref 6.5–8.1)

## 2023-12-08 LAB — FERRITIN: Ferritin: 19 ng/mL — ABNORMAL LOW (ref 24–336)

## 2023-12-08 LAB — IRON AND IRON BINDING CAPACITY (CC-WL,HP ONLY)
Iron: 24 ug/dL — ABNORMAL LOW (ref 45–182)
Saturation Ratios: 5 % — ABNORMAL LOW (ref 17.9–39.5)
TIBC: 490 ug/dL — ABNORMAL HIGH (ref 250–450)
UIBC: 466 ug/dL

## 2023-12-08 LAB — RETICULOCYTES
Immature Retic Fract: 12.2 % (ref 2.3–15.9)
RBC.: 5.84 MIL/uL — ABNORMAL HIGH (ref 4.22–5.81)
Retic Count, Absolute: 62.5 K/uL (ref 19.0–186.0)
Retic Ct Pct: 1.1 % (ref 0.4–3.1)

## 2023-12-08 NOTE — Progress Notes (Signed)
 Steve Nolan presents today for phlebotomy per MD orders. Phlebotomy procedure started at 1036 and ended at 1050. 500 grams removed. Patient observed for 30 minutes after procedure without any incident. Patient tolerated procedure well. IV needle removed intact.

## 2023-12-08 NOTE — Progress Notes (Signed)
 Hematology and Oncology Follow Up Visit  Steve Nolan [id number] Aug 10, 1960 63 y.o. 12/08/2023   Principle Diagnosis:  Erythrocytosis -- JAK 2 (-)/ ASXL1 (+)     Current Therapy:        Phlebotomy to maintain Hct < 45%   Interim History:  Steve Nolan is here today for follow-up.  We last saw him back in July.  Since then, he has been doing quite well.  He really has had no problems.  He was on a [place] cruise.  He had a wonderful time on the cruise.  I think he might be going on another 1, maybe over to [place], in December.  He has had no problems with fever.  He has had no cough or chest wall pain.  He has had no change in bowel or bladder habits.  He has had no issues with nausea or vomiting.  He has had no problems with rashes.  When we last saw him, his ferritin was 15 with an iron saturation of 7%.  He has had no issues with headaches.  He has had no mouth sores.  He has had no leg swelling.  Overall, I will say that his performance status is probably ECOG 1.   Overall, his performance status is ECOG 1.   Wt Readings from Last 3 Encounters:  12/08/23 190 lb (86.2 kg)  10/06/23 191 lb 12.8 oz (87 kg)  09/15/23 190 lb 6.4 oz (86.4 kg)     Medications:  Allergies as of 12/08/2023       Reactions   Sulfa Antibiotics Rash   Sulfur Rash        Medication List        Accurate as of December 08, 2023 10:17 AM. If you have any questions, ask your nurse or doctor.          tadalafil  5 MG tablet Commonly known as: CIALIS  Take 1 tablet (5 mg total) by mouth daily.   VITAMIN B 12 PO Place 1 tablet under the tongue once a week.        Allergies:  Allergies  Allergen Reactions   Sulfa Antibiotics Rash   Sulfur Rash    Past Medical History, Surgical history, Social history, and Family History were reviewed and updated.  Review of Systems: Review of Systems  Constitutional: Negative.   HENT: Negative.    Eyes: Negative.   Respiratory: Negative.     Cardiovascular: Negative.   Gastrointestinal: Negative.   Genitourinary: Negative.   Musculoskeletal: Negative.   Skin: Negative.   Neurological: Negative.   Endo/Heme/Allergies: Negative.   Psychiatric/Behavioral: Negative.       Physical Exam:  height is 5' 9 (1.753 m) and weight is 190 lb (86.2 kg). His oral temperature is 97.9 F (36.6 C). His blood pressure is 129/64 and his pulse is 62. His respiration is 18 and oxygen saturation is 99%.   Wt Readings from Last 3 Encounters:  12/08/23 190 lb (86.2 kg)  10/06/23 191 lb 12.8 oz (87 kg)  09/15/23 190 lb 6.4 oz (86.4 kg)    Physical Exam Vitals reviewed.  HENT:     Head: Normocephalic and atraumatic.  Eyes:     Pupils: Pupils are equal, round, and reactive to light.  Cardiovascular:     Rate and Rhythm: Normal rate and regular rhythm.     Heart sounds: Normal heart sounds.  Pulmonary:     Effort: Pulmonary effort is normal.     Breath sounds: Normal  breath sounds.  Abdominal:     General: Bowel sounds are normal.     Palpations: Abdomen is soft.     Hernia: There is no hernia in the left inguinal area or right inguinal area.  Musculoskeletal:        General: No tenderness or deformity. Normal range of motion.     Cervical back: Normal range of motion.  Lymphadenopathy:     Head:     Right side of head: No submental, submandibular, tonsillar, preauricular, posterior auricular or occipital adenopathy.     Left side of head: No submental, submandibular, tonsillar, preauricular, posterior auricular or occipital adenopathy.     Cervical: No cervical adenopathy.     Right cervical: No superficial, deep or posterior cervical adenopathy.    Left cervical: No superficial, deep or posterior cervical adenopathy.     Upper Body:     Right upper body: No supraclavicular, axillary, pectoral or epitrochlear adenopathy.     Left upper body: No supraclavicular, axillary, pectoral or epitrochlear adenopathy.     Lower Body: No  right inguinal adenopathy. No left inguinal adenopathy.  Skin:    General: Skin is warm and dry.     Findings: No erythema or rash.  Neurological:     Mental Status: He is alert and oriented to person, place, and time.  Psychiatric:        Behavior: Behavior normal.        Thought Content: Thought content normal.        Judgment: Judgment normal.      Lab Results  Component Value Date   WBC 5.1 12/08/2023   HGB 14.3 12/08/2023   HCT 45.2 12/08/2023   MCV 77.9 (L) 12/08/2023   PLT 244 12/08/2023   Lab Results  Component Value Date   FERRITIN 15 (L) 10/06/2023   IRON 36 (L) 10/06/2023   TIBC 494 (H) 10/06/2023   UIBC 458 10/06/2023   IRONPCTSAT 7 (L) 10/06/2023   Lab Results  Component Value Date   RETICCTPCT 1.1 12/08/2023   RBC 5.84 (H) 12/08/2023   RBC 5.80 12/08/2023   No results found for: KPAFRELGTCHN, LAMBDASER, KAPLAMBRATIO No results found for: IGGSERUM, IGA, IGMSERUM No results found for: STEPHANY CARLOTA BENSON MARKEL EARLA JOANNIE DOC VICK, SPEI   Chemistry      Component Value Date/Time   NA 139 12/08/2023 0928   K 4.0 12/08/2023 0928   CL 104 12/08/2023 0928   CO2 25 12/08/2023 0928   BUN 11 12/08/2023 0928   CREATININE 0.93 12/08/2023 0928      Component Value Date/Time   CALCIUM  9.0 12/08/2023 0928   ALKPHOS 78 12/08/2023 0928   AST 36 12/08/2023 0928   ALT 33 12/08/2023 0928   BILITOT 0.3 12/08/2023 0928       Impression and Plan: Steve Nolan is a very pleasant 63 yo gentleman with intermittent mild erythrocytosis.  He is JAK2 negative.  We have not had to do a bone marrow biopsy on him.  I really do not think a bone marrow biopsy would really help us  out right now.    We will go ahead and phlebotomize him his last [place] me was back in May.  I would like to get him back in December, I want to get him back before he goes on his cruise.  I want to make sure that we keep his hematocrit below 45%.   He feels a lot better when the hematocrit is below 45%.  Maude JONELLE Crease, MD 9/15/202510:17 AM

## 2024-03-03 ENCOUNTER — Other Ambulatory Visit: Payer: Self-pay

## 2024-03-03 ENCOUNTER — Ambulatory Visit

## 2024-03-03 ENCOUNTER — Other Ambulatory Visit: Attending: Hematology & Oncology

## 2024-03-03 ENCOUNTER — Encounter: Payer: Self-pay | Admitting: Hematology & Oncology

## 2024-03-03 ENCOUNTER — Ambulatory Visit: Admitting: Hematology & Oncology

## 2024-03-03 VITALS — BP 121/81 | HR 70 | Resp 20

## 2024-03-03 DIAGNOSIS — D751 Secondary polycythemia: Secondary | ICD-10-CM | POA: Diagnosis not present

## 2024-03-03 DIAGNOSIS — G8929 Other chronic pain: Secondary | ICD-10-CM

## 2024-03-03 DIAGNOSIS — R749 Abnormal serum enzyme level, unspecified: Secondary | ICD-10-CM | POA: Diagnosis not present

## 2024-03-03 LAB — CBC WITH DIFFERENTIAL (CANCER CENTER ONLY)
Abs Immature Granulocytes: 0.02 K/uL (ref 0.00–0.07)
Basophils Absolute: 0 K/uL (ref 0.0–0.1)
Basophils Relative: 0 %
Eosinophils Absolute: 0.3 K/uL (ref 0.0–0.5)
Eosinophils Relative: 4 %
HCT: 47.3 % (ref 39.0–52.0)
Hemoglobin: 15 g/dL (ref 13.0–17.0)
Immature Granulocytes: 0 %
Lymphocytes Relative: 31 %
Lymphs Abs: 2.5 K/uL (ref 0.7–4.0)
MCH: 24.8 pg — ABNORMAL LOW (ref 26.0–34.0)
MCHC: 31.7 g/dL (ref 30.0–36.0)
MCV: 78.1 fL — ABNORMAL LOW (ref 80.0–100.0)
Monocytes Absolute: 0.8 K/uL (ref 0.1–1.0)
Monocytes Relative: 9 %
Neutro Abs: 4.5 K/uL (ref 1.7–7.7)
Neutrophils Relative %: 56 %
Platelet Count: 278 K/uL (ref 150–400)
RBC: 6.06 MIL/uL — ABNORMAL HIGH (ref 4.22–5.81)
RDW: 14.6 % (ref 11.5–15.5)
WBC Count: 8.1 K/uL (ref 4.0–10.5)
nRBC: 0 % (ref 0.0–0.2)

## 2024-03-03 LAB — CMP (CANCER CENTER ONLY)
ALT: 18 U/L (ref 0–44)
AST: 24 U/L (ref 15–41)
Albumin: 4.3 g/dL (ref 3.5–5.0)
Alkaline Phosphatase: 90 U/L (ref 38–126)
Anion gap: 8 (ref 5–15)
BUN: 10 mg/dL (ref 8–23)
CO2: 30 mmol/L (ref 22–32)
Calcium: 9.3 mg/dL (ref 8.9–10.3)
Chloride: 103 mmol/L (ref 98–111)
Creatinine: 0.85 mg/dL (ref 0.61–1.24)
GFR, Estimated: >60 mL/min (ref >60)
Glucose, Bld: 112 mg/dL — ABNORMAL HIGH (ref 70–99)
Potassium: 3.9 mmol/L (ref 3.5–5.1)
Sodium: 140 mmol/L (ref 135–145)
Total Bilirubin: 0.4 mg/dL (ref 0.0–1.2)
Total Protein: 7.1 g/dL (ref 6.5–8.1)

## 2024-03-03 LAB — IRON AND IRON BINDING CAPACITY (CC-WL,HP ONLY)
Iron: 74 ug/dL (ref 45–182)
Saturation Ratios: 15 % — ABNORMAL LOW (ref 17.9–39.5)
TIBC: 480 ug/dL — ABNORMAL HIGH (ref 250–450)
UIBC: 406 ug/dL

## 2024-03-03 LAB — FERRITIN: Ferritin: 16 ng/mL — ABNORMAL LOW (ref 24–336)

## 2024-03-03 NOTE — Progress Notes (Signed)
 Hematology and Oncology Follow Up Visit  Steve Nolan Nolan [id number] 10-21-1960 62 y.o. 03/03/2024   Principle Diagnosis:  Erythrocytosis -- JAK 2 (-)/ ASXL1 (+)     Current Therapy:        Phlebotomy to maintain Hct < 45%   Interim History:  Steve Nolan Nolan is here today for follow-up.  We last saw him back in September.  He is going to go to [place] in a week.  He will be on a 2-week cruise.  I know he will have a great time.  He has low bit of a headache.  He thinks his hemoglobin is on the higher side.  He actually is quite right.  We will go ahead and phlebotomize him today.  He has had no issues with nausea or vomiting.  He has had no cough or shortness of breath.  He has had no chest wall pain.  He has had no change in bowel or bladder habits.  He has had no leg swelling.  He has had no rashes.  Overall, I will have to say that his performance status is probably ECOG 0.      Wt Readings from Last 3 Encounters:  12/08/23 190 lb (86.2 kg)  10/06/23 191 lb 12.8 oz (87 kg)  09/15/23 190 lb 6.4 oz (86.4 kg)     Medications:  Allergies as of 03/03/2024       Reactions   Sulfa Antibiotics Rash   Sulfur Rash        Medication List        Accurate as of March 03, 2024  9:56 AM. If you have any questions, ask your nurse or doctor.          tadalafil  5 MG tablet Commonly known as: CIALIS  Take 1 tablet (5 mg total) by mouth daily.   VITAMIN B 12 PO Place 1 tablet under the tongue once a week.        Allergies:  Allergies  Allergen Reactions   Sulfa Antibiotics Rash   Sulfur Rash    Past Medical History, Surgical history, Social history, and Family History were reviewed and updated.  Review of Systems: Review of Systems  Constitutional: Negative.   HENT: Negative.    Eyes: Negative.   Respiratory: Negative.    Cardiovascular: Negative.   Gastrointestinal: Negative.   Genitourinary: Negative.   Musculoskeletal: Negative.   Skin: Negative.    Neurological: Negative.   Endo/Heme/Allergies: Negative.   Psychiatric/Behavioral: Negative.       Physical Exam:  Vital signs show temperature of 97.6.  Pulse 71.  Blood pressure 123/77.  Weight is 193 pounds.  Wt Readings from Last 3 Encounters:  12/08/23 190 lb (86.2 kg)  10/06/23 191 lb 12.8 oz (87 kg)  09/15/23 190 lb 6.4 oz (86.4 kg)    Physical Exam Vitals reviewed.  HENT:     Head: Normocephalic and atraumatic.  Eyes:     Pupils: Pupils are equal, round, and reactive to light.  Cardiovascular:     Rate and Rhythm: Normal rate and regular rhythm.     Heart sounds: Normal heart sounds.  Pulmonary:     Effort: Pulmonary effort is normal.     Breath sounds: Normal breath sounds.  Abdominal:     General: Bowel sounds are normal.     Palpations: Abdomen is soft.     Hernia: There is no hernia in the left inguinal area or right inguinal area.  Musculoskeletal:  General: No tenderness or deformity. Normal range of motion.     Cervical back: Normal range of motion.  Lymphadenopathy:     Head:     Right side of head: No submental, submandibular, tonsillar, preauricular, posterior auricular or occipital adenopathy.     Left side of head: No submental, submandibular, tonsillar, preauricular, posterior auricular or occipital adenopathy.     Cervical: No cervical adenopathy.     Right cervical: No superficial, deep or posterior cervical adenopathy.    Left cervical: No superficial, deep or posterior cervical adenopathy.     Upper Body:     Right upper body: No supraclavicular, axillary, pectoral or epitrochlear adenopathy.     Left upper body: No supraclavicular, axillary, pectoral or epitrochlear adenopathy.     Lower Body: No right inguinal adenopathy. No left inguinal adenopathy.  Skin:    General: Skin is warm and dry.     Findings: No erythema or rash.  Neurological:     Mental Status: He is alert and oriented to person, place, and time.  Psychiatric:         Behavior: Behavior normal.        Thought Content: Thought content normal.        Judgment: Judgment normal.      Lab Results  Component Value Date   WBC 8.1 03/03/2024   HGB 15.0 03/03/2024   HCT 47.3 03/03/2024   MCV 78.1 (L) 03/03/2024   PLT 278 03/03/2024   Lab Results  Component Value Date   FERRITIN 19 (L) 12/08/2023   IRON 24 (L) 12/08/2023   TIBC 490 (H) 12/08/2023   UIBC 466 12/08/2023   IRONPCTSAT 5 (L) 12/08/2023   Lab Results  Component Value Date   RETICCTPCT 1.1 12/08/2023   RBC 6.06 (H) 03/03/2024   No results found for: KPAFRELGTCHN, LAMBDASER, KAPLAMBRATIO No results found for: IGGSERUM, IGA, IGMSERUM No results found for: STEPHANY CARLOTA BENSON MARKEL EARLA JOANNIE DOC VICK, SPEI   Chemistry      Component Value Date/Time   NA 140 03/03/2024 0846   K 3.9 03/03/2024 0846   CL 103 03/03/2024 0846   CO2 30 03/03/2024 0846   BUN 10 03/03/2024 0846   CREATININE 0.85 03/03/2024 0846      Component Value Date/Time   CALCIUM  9.3 03/03/2024 0846   ALKPHOS 90 03/03/2024 0846   AST 24 03/03/2024 0846   ALT 18 03/03/2024 0846   BILITOT 0.4 03/03/2024 0846       Impression and Plan: Steve Nolan Nolan is a very pleasant 63 yo gentleman with intermittent mild erythrocytosis.  He is JAK2 negative.  We have not had to do a bone marrow biopsy on him.  I really do not think a bone marrow biopsy would really help us  out right now.    Again, we will phlebotomize him today.  I would like to make sure his blood count is down before he goes over to [place].  He cannot take any aspirin because of ulcers.  I told him make sure he hydrates incredibly well on his trip.  We will plan to get him back to see him probably in the Spring.    Maude JONELLE Crease, MD 12/10/20259:56 AM

## 2024-03-03 NOTE — Progress Notes (Signed)
 Steve Nolan presents today for phlebotomy per MD orders. Phlebotomy procedure started at 0950 and ended at 1015. 550 grams removed. Patient observed for 30 minutes after procedure without any incident. Patient tolerated procedure well. IV needle removed intact.  Pt declined to  Stay for 30 minute observation time. VSS, denies distress. Steady gait noted upon discharge.

## 2024-03-03 NOTE — Patient Instructions (Signed)

## 2024-03-09 ENCOUNTER — Encounter: Payer: Self-pay | Admitting: Hematology & Oncology

## 2024-03-16 ENCOUNTER — Encounter: Payer: Self-pay | Admitting: Hematology & Oncology

## 2024-03-16 ENCOUNTER — Other Ambulatory Visit: Payer: Self-pay | Admitting: Urology

## 2024-03-16 DIAGNOSIS — R3912 Poor urinary stream: Secondary | ICD-10-CM

## 2024-04-16 ENCOUNTER — Other Ambulatory Visit: Payer: Self-pay | Admitting: Urology

## 2024-04-30 ENCOUNTER — Other Ambulatory Visit: Payer: Self-pay

## 2024-04-30 ENCOUNTER — Encounter: Payer: Self-pay | Admitting: Hematology & Oncology

## 2024-04-30 DIAGNOSIS — G8929 Other chronic pain: Secondary | ICD-10-CM

## 2024-06-15 ENCOUNTER — Ambulatory Visit (HOSPITAL_COMMUNITY): Admit: 2024-06-15 | Admitting: Urology

## 2024-06-16 ENCOUNTER — Inpatient Hospital Stay

## 2024-06-16 ENCOUNTER — Inpatient Hospital Stay: Admitting: Hematology & Oncology

## 2024-08-11 ENCOUNTER — Inpatient Hospital Stay

## 2024-08-11 ENCOUNTER — Inpatient Hospital Stay: Admitting: Hematology & Oncology

## 2024-09-07 ENCOUNTER — Other Ambulatory Visit

## 2024-09-10 ENCOUNTER — Ambulatory Visit

## 2024-09-15 ENCOUNTER — Encounter: Admitting: Family Medicine
